# Patient Record
Sex: Female | Born: 1935 | Hispanic: No | Smoking: Former smoker
Health system: Southern US, Community
[De-identification: ages and names within clinical notes are randomized; demographics above are authoritative.]

## PROBLEM LIST (undated history)

## (undated) DIAGNOSIS — G20 Parkinson's disease: Secondary | ICD-10-CM

## (undated) DIAGNOSIS — G20A1 Parkinson's disease without dyskinesia, without mention of fluctuations: Secondary | ICD-10-CM

## (undated) DIAGNOSIS — F0390 Unspecified dementia without behavioral disturbance: Secondary | ICD-10-CM

## (undated) HISTORY — PX: CHOLECYSTECTOMY: SHX55

---

## 1998-08-04 ENCOUNTER — Ambulatory Visit (HOSPITAL_COMMUNITY): Admission: RE | Admit: 1998-08-04 | Discharge: 1998-08-04 | Payer: Self-pay | Admitting: Obstetrics & Gynecology

## 2011-08-09 ENCOUNTER — Ambulatory Visit: Payer: Self-pay | Admitting: Family Medicine

## 2015-10-30 ENCOUNTER — Emergency Department
Admission: EM | Admit: 2015-10-30 | Discharge: 2015-10-30 | Disposition: A | Discharge status: Home / Self Care | Payer: Medicare Other | Attending: Emergency Medicine | Admitting: Emergency Medicine

## 2015-10-30 ENCOUNTER — Encounter: Payer: Self-pay | Admitting: *Deleted

## 2015-10-30 ENCOUNTER — Emergency Department: Payer: Medicare Other

## 2015-10-30 DIAGNOSIS — G20 Parkinson's disease: Secondary | ICD-10-CM | POA: Diagnosis not present

## 2015-10-30 DIAGNOSIS — Z79899 Other long term (current) drug therapy: Secondary | ICD-10-CM | POA: Insufficient documentation

## 2015-10-30 DIAGNOSIS — Z87891 Personal history of nicotine dependence: Secondary | ICD-10-CM | POA: Diagnosis not present

## 2015-10-30 DIAGNOSIS — F0390 Unspecified dementia without behavioral disturbance: Secondary | ICD-10-CM | POA: Diagnosis not present

## 2015-10-30 DIAGNOSIS — M25511 Pain in right shoulder: Secondary | ICD-10-CM | POA: Insufficient documentation

## 2015-10-30 HISTORY — DX: Parkinson's disease without dyskinesia, without mention of fluctuations: G20.A1

## 2015-10-30 HISTORY — DX: Parkinson's disease: G20

## 2015-10-30 HISTORY — DX: Unspecified dementia, unspecified severity, without behavioral disturbance, psychotic disturbance, mood disturbance, and anxiety: F03.90

## 2015-10-30 LAB — TROPONIN I: Troponin I: <0.03 ng/mL (ref <0.03)

## 2015-10-30 LAB — CBC WITH DIFFERENTIAL/PLATELET
Basophils Absolute: 0.1 K/uL (ref 0–0.1)
Basophils Relative: 1 %
Eosinophils Absolute: 0 K/uL (ref 0–0.7)
Eosinophils Relative: 0 %
HCT: 40 % (ref 35.0–47.0)
Hemoglobin: 14.1 g/dL (ref 12.0–16.0)
Lymphocytes Relative: 5 %
Lymphs Abs: 0.5 K/uL — ABNORMAL LOW (ref 1.0–3.6)
MCH: 31.8 pg (ref 26.0–34.0)
MCHC: 35.2 g/dL (ref 32.0–36.0)
MCV: 90.4 fL (ref 80.0–100.0)
Monocytes Absolute: 0.4 K/uL (ref 0.2–0.9)
Monocytes Relative: 4 %
Neutro Abs: 9.6 K/uL — ABNORMAL HIGH (ref 1.4–6.5)
Neutrophils Relative %: 90 %
Platelets: 138 K/uL — ABNORMAL LOW (ref 150–440)
RBC: 4.42 MIL/uL (ref 3.80–5.20)
RDW: 13.4 % (ref 11.5–14.5)
WBC: 10.6 K/uL (ref 3.6–11.0)

## 2015-10-30 LAB — BASIC METABOLIC PANEL WITH GFR
Anion gap: 6 (ref 5–15)
BUN: 19 mg/dL (ref 6–20)
CO2: 28 mmol/L (ref 22–32)
Calcium: 9.1 mg/dL (ref 8.9–10.3)
Chloride: 106 mmol/L (ref 101–111)
Creatinine, Ser: 0.93 mg/dL (ref 0.44–1.00)
GFR calc Af Amer: >60 mL/min
GFR calc non Af Amer: 57 mL/min — ABNORMAL LOW
Glucose, Bld: 107 mg/dL — ABNORMAL HIGH (ref 65–99)
Potassium: 3.6 mmol/L (ref 3.5–5.1)
Sodium: 140 mmol/L (ref 135–145)

## 2015-10-30 NOTE — Discharge Instructions (Signed)
Please seek medical attention for any high fevers, chest pain, shortness of breath, change in behavior, persistent vomiting, bloody stool or any other new or concerning symptoms.  

## 2015-10-30 NOTE — ED Provider Notes (Signed)
[facility] Emergency Department Provider Note   ____________________________________________   I have reviewed the triage vital signs and the nursing notes.   HISTORY  Chief Complaint Arm Pain   History limited by: Not Limited   HPI Allison Pollard is a 82 y.o. female who presents to the emergency room today because of complaints for right shoulder pain. The patient states that the pain started this morning. Did have an unwitnessed fall last night. Family thinks that she might have been walking to the bathroom. They found her on the ground lying on her left side but complaining of right shoulder pain. She was able to get up and walk with her walker. No recent fevers, chest pain or shortness of breath.   Past Medical History:  Diagnosis Date  . Dementia   . Parkinson disease (HCC)     There are no active problems to display for this patient.   Past Surgical History:  Procedure Laterality Date  . CHOLECYSTECTOMY      Prior to Admission medications   Medication Sig Start Date End Date Taking? Authorizing Provider  carbidopa-levodopa (SINEMET IR) 25-100 MG tablet Take 0.5 tablets by mouth 3 (three) times daily. 10/17/15  Yes Historical Provider, MD  Cholecalciferol (VITAMIN D-3) 1000 units CAPS Take 2,000 Units by mouth every morning.   Yes Historical Provider, MD  donepezil (ARICEPT) 10 MG tablet Take 10 mg by mouth every evening.  10/17/15  Yes Historical Provider, MD  fluticasone (FLONASE) 50 MCG/ACT nasal spray Place 2 sprays into both nostrils daily as needed for rhinitis. 09/21/15  Yes Historical Provider, MD  methimazole (TAPAZOLE) 5 MG tablet Take 2.5 mg by mouth every morning. 10/17/15  Yes Historical Provider, MD  mirtazapine (REMERON) 30 MG tablet Take 30 mg by mouth every evening. 10/17/15  Yes Historical Provider, MD  traZODone (DESYREL) 50 MG tablet Take 25 mg by mouth 3 (three) times daily. 09/08/15  Yes Historical Provider, MD     Allergies Buspirone; Citalopram; Clonazepam; Hydroxyzine; Sertraline; Codeine; Hydrocodone; Neomycin; Oxycodone; Penicillins; and Sulfa antibiotics  History reviewed. No pertinent family history.  Social History Social History  Substance Use Topics  . Smoking status: Former Games developermoker  . Smokeless tobacco: Never Used  . Alcohol use Not on file    Review of Systems  Constitutional: Negative for fever. Cardiovascular: Negative for chest pain. Respiratory: Negative for shortness of breath. Gastrointestinal: Negative for abdominal pain, vomiting and diarrhea. Genitourinary: Negative for dysuria. Musculoskeletal: Positive for right shoulder pain. Skin: Negative for rash. Neurological: Negative for headaches, focal weakness or numbness.  10-point ROS otherwise negative.  ____________________________________________   PHYSICAL EXAM:  VITAL SIGNS: ED Triage Vitals  Enc Vitals Group     BP 10/30/15 0932 (!) 168/96     Pulse Rate 10/30/15 0932 89     Resp 10/30/15 0932 18     Temp 10/30/15 0932 98.6 F (37 C)     Temp Source 10/30/15 0932 Oral     SpO2 10/30/15 0932 100 %     Weight 10/30/15 0933 105 lb (47.6 kg)     Height 10/30/15 0933 5\' 2"  (1.575 m)     Head Circumference --      Peak Flow --      Pain Score 10/30/15 0931 3   Constitutional: Alert and oriented.  Eyes: Conjunctivae are normal. Normal extraocular movements. ENT   Head: Normocephalic and atraumatic.   Nose: No congestion/rhinnorhea.   Mouth/Throat: Mucous membranes are moist.   Neck:  No stridor. Hematological/Lymphatic/Immunilogical: No cervical lymphadenopathy. Cardiovascular: Normal rate, regular rhythm.  No murmurs, rubs, or gallops. Respiratory: Normal respiratory effort without tachypnea nor retractions. Breath sounds are clear and equal bilaterally. No wheezes/rales/rhonchi. Gastrointestinal: Soft and nontender. No distention.  Genitourinary: Deferred Musculoskeletal: Very minimal  tenderness to palpation of the patients right shoulder. No obvious deformity. RP 2+ in right upper extremity. Range of motion is decreased in the right shoulder.  Neurologic:  Normal speech and language.  Skin:  Skin is warm, dry and intact. No rash noted. Psychiatric: Mood and affect are normal. Speech and behavior are normal. Patient exhibits appropriate insight and judgment.  ____________________________________________    LABS (pertinent positives/negatives)  Labs Reviewed  CBC WITH DIFFERENTIAL/PLATELET - Abnormal; Notable for the following:       Result Value   Platelets 138 (*)    Neutro Abs 9.6 (*)    Lymphs Abs 0.5 (*)    All other components within normal limits  BASIC METABOLIC PANEL - Abnormal; Notable for the following:    Glucose, Bld 107 (*)    GFR calc non Af Amer 57 (*)    All other components within normal limits  TROPONIN I     ____________________________________________   EKG  None  ____________________________________________    RADIOLOGY  Right shoulder IMPRESSION:  Moderate degenerative joint disease of right acromioclavicular  joint. No acute abnormality seen in right shoulder.     CT head   IMPRESSION:  No acute intracranial abnormality. Mild cerebral atrophy.  Periventricular and patchy subcortical white matter decreased  attenuation probable due to chronic small vessel ischemic changes.  No acute cortical infarction.     ___________________________________________   PROCEDURES  Procedures  ____________________________________________   INITIAL IMPRESSION / ASSESSMENT AND PLAN / ED COURSE  Pertinent labs & imaging results that were available during my care of the patient were reviewed by me and considered in my medical decision making (see chart for details).  Patient presented to the emergency department today after an unwitnessed fall complaining of right shoulder pain. On exam patient was minimally tender in the right  shoulder. She did have decreased range of motion of that right shoulder. It did not appear to be painful as active moving her shoulder. Shoulder x-ray was negative. Had x-ray negative. On reexam patient was able to move her right arm better. Family states that she will have intermittent episodes where she is not able to move her arms or walk is easily and then be able to do it easily. This point I discussed I cannot fully rule out a stroke of her family is comfortable taking patient home. Think this is reasonable. ____________________________________________   FINAL CLINICAL IMPRESSION(S) / ED DIAGNOSES  Final diagnoses:  Right shoulder pain     Note: This dictation was prepared with Dragon dictation. Any transcriptional errors that result from this process are unintentional    Phineas Semen, MD 10/30/15 1337

## 2015-10-30 NOTE — ED Triage Notes (Signed)
Pt arrives via EMS from home after she rolled out of bed last night, states right arm pain upon arrival, EDP at bedside

## 2015-12-21 ENCOUNTER — Emergency Department: Payer: Medicare Other

## 2015-12-21 ENCOUNTER — Inpatient Hospital Stay
Admission: EM | Admit: 2015-12-21 | Discharge: 2015-12-24 | DRG: 469 | Disposition: A | Discharge status: Skilled Nursing Facility | Payer: Medicare Other | Attending: Internal Medicine | Admitting: Internal Medicine

## 2015-12-21 DIAGNOSIS — Z880 Allergy status to penicillin: Secondary | ICD-10-CM

## 2015-12-21 DIAGNOSIS — N390 Urinary tract infection, site not specified: Secondary | ICD-10-CM | POA: Diagnosis present

## 2015-12-21 DIAGNOSIS — Z833 Family history of diabetes mellitus: Secondary | ICD-10-CM

## 2015-12-21 DIAGNOSIS — E43 Unspecified severe protein-calorie malnutrition: Secondary | ICD-10-CM | POA: Diagnosis present

## 2015-12-21 DIAGNOSIS — Z87891 Personal history of nicotine dependence: Secondary | ICD-10-CM | POA: Diagnosis not present

## 2015-12-21 DIAGNOSIS — S72012A Unspecified intracapsular fracture of left femur, initial encounter for closed fracture: Secondary | ICD-10-CM | POA: Diagnosis present

## 2015-12-21 DIAGNOSIS — G20 Parkinson's disease: Secondary | ICD-10-CM | POA: Diagnosis present

## 2015-12-21 DIAGNOSIS — G8918 Other acute postprocedural pain: Secondary | ICD-10-CM

## 2015-12-21 DIAGNOSIS — I700 Atherosclerosis of aorta: Secondary | ICD-10-CM | POA: Diagnosis present

## 2015-12-21 DIAGNOSIS — Z681 Body mass index (BMI) 19 or less, adult: Secondary | ICD-10-CM | POA: Diagnosis not present

## 2015-12-21 DIAGNOSIS — S72002A Fracture of unspecified part of neck of left femur, initial encounter for closed fracture: Secondary | ICD-10-CM | POA: Diagnosis present

## 2015-12-21 DIAGNOSIS — Z820 Family history of epilepsy and other diseases of the nervous system: Secondary | ICD-10-CM | POA: Diagnosis not present

## 2015-12-21 DIAGNOSIS — Z888 Allergy status to other drugs, medicaments and biological substances status: Secondary | ICD-10-CM

## 2015-12-21 DIAGNOSIS — W1830XA Fall on same level, unspecified, initial encounter: Secondary | ICD-10-CM | POA: Diagnosis present

## 2015-12-21 DIAGNOSIS — F0390 Unspecified dementia without behavioral disturbance: Secondary | ICD-10-CM | POA: Diagnosis present

## 2015-12-21 DIAGNOSIS — Z79899 Other long term (current) drug therapy: Secondary | ICD-10-CM | POA: Diagnosis not present

## 2015-12-21 DIAGNOSIS — Z882 Allergy status to sulfonamides status: Secondary | ICD-10-CM

## 2015-12-21 DIAGNOSIS — Z419 Encounter for procedure for purposes other than remedying health state, unspecified: Secondary | ICD-10-CM

## 2015-12-21 DIAGNOSIS — F329 Major depressive disorder, single episode, unspecified: Secondary | ICD-10-CM | POA: Diagnosis present

## 2015-12-21 DIAGNOSIS — Z7951 Long term (current) use of inhaled steroids: Secondary | ICD-10-CM

## 2015-12-21 DIAGNOSIS — M6281 Muscle weakness (generalized): Secondary | ICD-10-CM

## 2015-12-21 DIAGNOSIS — R262 Difficulty in walking, not elsewhere classified: Secondary | ICD-10-CM

## 2015-12-21 DIAGNOSIS — M25552 Pain in left hip: Secondary | ICD-10-CM | POA: Diagnosis present

## 2015-12-21 LAB — BASIC METABOLIC PANEL
ANION GAP: 7 (ref 5–15)
BUN: 15 mg/dL (ref 6–20)
CHLORIDE: 101 mmol/L (ref 101–111)
CO2: 30 mmol/L (ref 22–32)
Calcium: 9.1 mg/dL (ref 8.9–10.3)
Creatinine, Ser: 0.92 mg/dL (ref 0.44–1.00)
GFR calc Af Amer: >60 mL/min (ref >60)
GFR calc non Af Amer: 57 mL/min — ABNORMAL LOW (ref >60)
GLUCOSE: 142 mg/dL — AB (ref 65–99)
POTASSIUM: 3.5 mmol/L (ref 3.5–5.1)
Sodium: 138 mmol/L (ref 135–145)

## 2015-12-21 LAB — URINALYSIS COMPLETE WITH MICROSCOPIC (ARMC ONLY)
BILIRUBIN URINE: NEGATIVE
GLUCOSE, UA: NEGATIVE mg/dL
NITRITE: NEGATIVE
Protein, ur: 100 mg/dL — AB
SPECIFIC GRAVITY, URINE: 1.013 (ref 1.005–1.030)
Squamous Epithelial / LPF: NONE SEEN
pH: 7 (ref 5.0–8.0)

## 2015-12-21 LAB — CBC
HEMATOCRIT: 32.3 % — AB (ref 35.0–47.0)
HEMOGLOBIN: 11.2 g/dL — AB (ref 12.0–16.0)
MCH: 31.1 pg (ref 26.0–34.0)
MCHC: 34.6 g/dL (ref 32.0–36.0)
MCV: 89.9 fL (ref 80.0–100.0)
Platelets: 115 10*3/uL — ABNORMAL LOW (ref 150–440)
RBC: 3.59 MIL/uL — AB (ref 3.80–5.20)
RDW: 13.8 % (ref 11.5–14.5)
WBC: 7.9 10*3/uL (ref 3.6–11.0)

## 2015-12-21 LAB — APTT: aPTT: 30 seconds (ref 24–36)

## 2015-12-21 LAB — SURGICAL PCR SCREEN
MRSA, PCR: NEGATIVE
STAPHYLOCOCCUS AUREUS: NEGATIVE

## 2015-12-21 LAB — TYPE AND SCREEN
ABO/RH(D): A POS
Antibody Screen: NEGATIVE

## 2015-12-21 LAB — PROTIME-INR
INR: 1.17
Prothrombin Time: 15 seconds (ref 11.4–15.2)

## 2015-12-21 MED ORDER — CLINDAMYCIN PHOSPHATE 600 MG/50ML IV SOLN
600.0000 mg | Freq: Once | INTRAVENOUS | Status: AC
  Administered 2015-12-22: 600 mg via INTRAVENOUS
  Filled 2015-12-21: qty 50

## 2015-12-21 MED ORDER — METHIMAZOLE 5 MG PO TABS
2.5000 mg | ORAL_TABLET | ORAL | Status: DC
  Administered 2015-12-23 – 2015-12-24 (×2): 2.5 mg via ORAL
  Filled 2015-12-21 (×2): qty 1

## 2015-12-21 MED ORDER — VITAMIN D 1000 UNITS PO TABS
2000.0000 [IU] | ORAL_TABLET | ORAL | Status: DC
  Administered 2015-12-23: 2000 [IU] via ORAL
  Filled 2015-12-21: qty 2

## 2015-12-21 MED ORDER — SODIUM CHLORIDE 0.9 % IV SOLN
INTRAVENOUS | Status: DC
  Administered 2015-12-22: 50 mL/h via INTRAVENOUS

## 2015-12-21 MED ORDER — MORPHINE SULFATE (PF) 2 MG/ML IV SOLN: 2.0000 mg | INTRAVENOUS | Status: DC | PRN

## 2015-12-21 MED ORDER — FLUTICASONE PROPIONATE 50 MCG/ACT NA SUSP
2.0000 | Freq: Every day | NASAL | Status: DC | PRN
  Filled 2015-12-21: qty 16

## 2015-12-21 MED ORDER — MORPHINE SULFATE (PF) 2 MG/ML IV SOLN
1.0000 mg | INTRAVENOUS | Status: DC | PRN
  Filled 2015-12-21: qty 1

## 2015-12-21 MED ORDER — QUETIAPINE FUMARATE 25 MG PO TABS
25.0000 mg | ORAL_TABLET | Freq: Two times a day (BID) | ORAL | Status: DC
  Administered 2015-12-21 – 2015-12-24 (×6): 25 mg via ORAL
  Filled 2015-12-21 (×6): qty 1

## 2015-12-21 MED ORDER — FENTANYL CITRATE (PF) 100 MCG/2ML IJ SOLN
25.0000 ug | Freq: Once | INTRAMUSCULAR | Status: AC
  Administered 2015-12-21: 25 ug via INTRAVENOUS
  Filled 2015-12-21: qty 2

## 2015-12-21 MED ORDER — TRAZODONE HCL 50 MG PO TABS
25.0000 mg | ORAL_TABLET | Freq: Three times a day (TID) | ORAL | Status: DC
  Administered 2015-12-21 – 2015-12-24 (×9): 25 mg via ORAL
  Filled 2015-12-21 (×9): qty 1

## 2015-12-21 MED ORDER — CARBIDOPA-LEVODOPA 25-100 MG PO TABS
0.5000 | ORAL_TABLET | Freq: Three times a day (TID) | ORAL | Status: DC
  Administered 2015-12-21 – 2015-12-24 (×8): 0.5 via ORAL
  Filled 2015-12-21: qty 1
  Filled 2015-12-21: qty 0.5
  Filled 2015-12-21 (×4): qty 1
  Filled 2015-12-21: qty 0.5
  Filled 2015-12-21 (×2): qty 1

## 2015-12-21 MED ORDER — ACETAMINOPHEN 650 MG RE SUPP: 650.0000 mg | Freq: Four times a day (QID) | RECTAL | Status: DC | PRN

## 2015-12-21 MED ORDER — SODIUM CHLORIDE 0.9 % IV SOLN: Freq: Once | INTRAVENOUS | Status: DC

## 2015-12-21 MED ORDER — MIRTAZAPINE 15 MG PO TABS
30.0000 mg | ORAL_TABLET | Freq: Every evening | ORAL | Status: DC
  Administered 2015-12-21 – 2015-12-24 (×4): 30 mg via ORAL
  Filled 2015-12-21 (×4): qty 2

## 2015-12-21 MED ORDER — ONDANSETRON HCL 4 MG/2ML IJ SOLN: 4.0000 mg | Freq: Four times a day (QID) | INTRAMUSCULAR | Status: DC | PRN

## 2015-12-21 MED ORDER — DONEPEZIL HCL 5 MG PO TABS
10.0000 mg | ORAL_TABLET | Freq: Every evening | ORAL | Status: DC
  Administered 2015-12-21 – 2015-12-24 (×4): 10 mg via ORAL
  Filled 2015-12-21 (×4): qty 2

## 2015-12-21 MED ORDER — ONDANSETRON HCL 4 MG PO TABS: 4.0000 mg | ORAL_TABLET | Freq: Four times a day (QID) | ORAL | Status: DC | PRN

## 2015-12-21 MED ORDER — ACETAMINOPHEN 325 MG PO TABS
650.0000 mg | ORAL_TABLET | Freq: Four times a day (QID) | ORAL | Status: DC | PRN
  Administered 2015-12-21 – 2015-12-23 (×2): 650 mg via ORAL
  Filled 2015-12-21 (×2): qty 2

## 2015-12-21 MED ORDER — SODIUM CHLORIDE 0.9 % IV BOLUS (SEPSIS)
500.0000 mL | Freq: Once | INTRAVENOUS | Status: AC
  Administered 2015-12-21: 500 mL via INTRAVENOUS

## 2015-12-21 MED ORDER — PANTOPRAZOLE SODIUM 40 MG PO TBEC
40.0000 mg | DELAYED_RELEASE_TABLET | Freq: Every day | ORAL | Status: DC
  Administered 2015-12-23: 40 mg via ORAL
  Filled 2015-12-21 (×2): qty 1

## 2015-12-21 NOTE — Consult Note (Signed)
82-year-old white female seen for evaluation of left femoral neck fracture. She had a fall 3 days ago 4 days ago and had some difficulty walking but she always does. She normally uses a walker and is essentially just a household ambulator. She suffers from dementia as well as parkinsonism and has been declining over the past year. She has also had multiple falls. She felt a pop and went to the ground brought to the emergency room where she is found to have a displaced femoral neck fracture.  On exam her leg is shortened and actually rotated on the left. She has a trace to says pedis pulse, able to flex extend the toes, has intact sensation to the plantar and dorsal aspect of the foot. Skin around the hip is normal.  Radiographic exam: X-rays show a completely displaced femoral neck fracture subcapital. Minimal degenerative arthritis to the hip.  Plan: Recommendation is for left hip hemiarthroplasty with a direct anterior approach. This should minimize risk of dislocation with her parkinsonism and allow for early mobilization. Discussed risks of surgery in particular blood clot, infection and fracture.

## 2015-12-21 NOTE — Progress Notes (Signed)
PASRR number screen complete. Pt's PASRR # is [name], MSW, Theresia MajorsLCSWA [phone]

## 2015-12-21 NOTE — ED Notes (Signed)
Pt in via triage, coming directly from xray, pt positive for left hip fx.  Pt family reports fall on Saturday, up and ambulatory since then but complaining of increasing pain over the last few days.  Pt A/Ox4, vitals WDL, no immediate distress at this time.  MD at bedside.

## 2015-12-21 NOTE — ED Triage Notes (Signed)
Per pt daughter pt fell Saturday and has been up walking with walker as normal, today c/o severe pain in the left hip and is unable to bare wt..Marland Kitchen

## 2015-12-21 NOTE — ED Notes (Signed)
Offered Morphine to patient; pt denies any pain at this time and refuses pain medication.  Explained to pt that we would soon be moving her to a room and would have to transfer from one bed to another.  Pt still refuses pain medication.

## 2015-12-21 NOTE — Clinical Social Work Note (Signed)
Clinical Social Work Assessment  Patient Details  Name: Allison RileBarbara H Gelb MRN: [id number] Date of Birth: Mar 07, 1933  Date of referral:  12/21/15               Reason for consult:  Facility Placement                Permission sought to share information with:  Family Supports, Oceanographeracility Contact Representative Permission granted to share information::  Yes, Verbal Permission Granted  Name::        Agency::     Relationship::     Contact Information:     Housing/Transportation Living arrangements for the past 2 months:  Single Family Home Source of Information:  Adult Children Patient Interpreter Needed:  None Criminal Activity/Legal Involvement Pertinent to Current Situation/Hospitalization:  No - Comment as needed Significant Relationships:  Adult Children, Other Family Members Lives with:  Self Do you feel safe going back to the place where you live?  No Need for family participation in patient care:  Yes (Comment)  Care giving concerns: Pt had a fall at home and suffered a hip fracture.   Social Worker assessment / plan:  CSW received consult for possible SNF placement. CSW engaged with pt and pt's daughter at pt's bedside. CSW introduced herself and her role as a Child psychotherapistsocial worker. Pt currently lives alone in her home but has a son that lives next door. Pt's son comes over daily to have meals with the pt and checks on her frequently. Pt also receives home health services. Per pt's daughter pt had a fall at home and has been experiencing hip pain ever since. Pt will be admitted to the hospital and will have surgery. Upon d/c from the hospital pt and pt's daughter are agreeable to pt going to SNF for rehab. Pt's daughter states that she would like for pt to return home after rehab is complete. During the time of the assessment pt was in and out of sleep, but pleasant. Pt's daughter explained that pt has fluctuating levels of orientation and often "travels back in time" due to her dementia  diagnosis.   CSW explained the SNF referral process to pt's daughter and answered questions that she had. Pt's daughter did not identify a facility preference, however, she did mention that she would like for pt to stay local to [facility]. CSW completed FL2, submitted PASRR number, and faxed out referral to local SNFs. CSW is still awaiting PASRR number. CSW also provided pt's daughter with a list of facilities in the area. Ortho CSW is aware and will continue to follow pt once she is admitted.  Employment status:  Retired Database administratornsurance information:  Managed Medicare PT Recommendations:  Not assessed at this time Information / Referral to community resources:  Skilled Nursing Facility  Patient/Family's Response to care: Pt will be admitted to the hospital and will transfer to SNF upon d/c.  Patient/Family's Understanding of and Emotional Response to Diagnosis, Current Treatment, and Prognosis: Pt and pt's family are appreciative of the care provided by CSW at this time.  Emotional Assessment Appearance:  Appears stated age Attitude/Demeanor/Rapport:  Lethargic Affect (typically observed):  Calm Orientation:  Oriented to Self, Fluctuating Orientation (Suspected and/or reported Sundowners) Alcohol / Substance use:  Not Applicable Psych involvement (Current and /or in the community):  No (Comment)  Discharge Needs  Concerns to be addressed:  Care Coordination Readmission within the last 30 days:  No Current discharge risk:  None Barriers to Discharge:  Continued  Medical Work up   KeyCorpLynn B Juleah Paradise, LCSWA 12/21/2015, 1:45 PM

## 2015-12-21 NOTE — H&P (Signed)
[facility] [place] [facility]   PATIENT NAME: Allison Pollard    MR#:  [id number]  DATE OF BIRTH:  Jan 19, 1934  DATE OF ADMISSION:  12/21/2015  PRIMARY CARE PHYSICIAN: HART, Charlton Amor, MD   REQUESTING/REFERRING PHYSICIAN: Dr. Nita Sickle  CHIEF COMPLAINT:   Chief Complaint  Patient presents with  . Hip Pain    HISTORY OF PRESENT ILLNESS:  Allison Pollard  is a 82 y.o. female with a known history of Parkinson's disease, dementia who presented to the hospital due to left hip pain and difficulty walking. Patient had a fall at home 4 days ago but was able to get herself up and ambulate. Today when she got up from the toilet she heard a pop on her left side of her hip and went to the ground. She was not able to ambulate or bear any weight on the left side and therefore the daughter brought her to the ER for further evaluation. Patient's x-rays were consistent with a left hip fracture and therefore hospitalist services were contacted further treatment and evaluation. Patient had no head trauma related to her previous fall. She denies any prodromal symptoms prior to her fall.  PAST MEDICAL HISTORY:   Past Medical History:  Diagnosis Date  . Dementia   . Parkinson disease (HCC)     PAST SURGICAL HISTORY:   Past Surgical History:  Procedure Laterality Date  . CHOLECYSTECTOMY      SOCIAL HISTORY:   Social History  Substance Use Topics  . Smoking status: Former Games developer  . Smokeless tobacco: Never Used  . Alcohol use No    FAMILY HISTORY:   Family History  Problem Relation Age of Onset  . Diabetes Mother   . Alzheimer's disease Sister   . Alzheimer's disease Brother     DRUG ALLERGIES:   Allergies  Allergen Reactions  . Buspirone Other (See Comments)    UTI and Shakiness  . Citalopram Other (See Comments)    Severe Confusion  . Clonazepam Other (See Comments)    Panic attacks and paranoid  . Hydroxyzine Hives  . Sertraline Other (See  Comments)    parkinson's type shaking  . Codeine Rash  . Hydrocodone Rash  . Neomycin Rash  . Oxycodone Rash  . Penicillins Rash    Has patient had a PCN reaction causing immediate rash, facial/tongue/throat swelling, SOB or lightheadedness with hypotension: Yes Has patient had a PCN reaction causing severe rash involving mucus membranes or skin necrosis: No Has patient had a PCN reaction that required hospitalization No Has patient had a PCN reaction occurring within the last 10 years: No If all of the above answers are "NO", then may proceed with Cephalosporin use.  . Sulfa Antibiotics Rash    REVIEW OF SYSTEMS:   Review of Systems  Constitutional: Negative for fever and weight loss.  HENT: Negative for congestion, nosebleeds and tinnitus.   Eyes: Negative for blurred vision, double vision and redness.  Respiratory: Negative for cough, hemoptysis and shortness of breath.   Cardiovascular: Negative for chest pain, orthopnea, leg swelling and PND.  Gastrointestinal: Negative for abdominal pain, diarrhea, melena, nausea and vomiting.  Genitourinary: Negative for dysuria, hematuria and urgency.  Musculoskeletal: Positive for falls and joint pain (left Hip).  Neurological: Negative for dizziness, tingling, sensory change, focal weakness, seizures, weakness and headaches.  Endo/Heme/Allergies: Negative for polydipsia. Does not bruise/bleed easily.  Psychiatric/Behavioral: Negative for depression and memory loss. The patient is not nervous/anxious.  MEDICATIONS AT HOME:   Prior to Admission medications   Medication Sig Start Date End Date Taking? Authorizing Provider  carbidopa-levodopa (SINEMET IR) 25-100 MG tablet Take 0.5 tablets by mouth 3 (three) times daily. 10/17/15  Yes Historical Provider, MD  Cholecalciferol (VITAMIN D-3) 1000 units CAPS Take 2,000 Units by mouth every morning.   Yes Historical Provider, MD  donepezil (ARICEPT) 10 MG tablet Take 10 mg by mouth every  evening.  10/17/15  Yes Historical Provider, MD  fluticasone (FLONASE) 50 MCG/ACT nasal spray Place 2 sprays into both nostrils daily as needed for rhinitis. 09/21/15  Yes Historical Provider, MD  methimazole (TAPAZOLE) 5 MG tablet Take 2.5 mg by mouth every morning. 10/17/15  Yes Historical Provider, MD  mirtazapine (REMERON) 30 MG tablet Take 30 mg by mouth every evening. 10/17/15  Yes Historical Provider, MD  mupirocin ointment (BACTROBAN) 2 % Apply 1 application topically 3 (three) times daily. 12/14/15 12/21/15 Yes Historical Provider, MD  omeprazole (PRILOSEC) 20 MG capsule Take 1 capsule by mouth daily. 11/24/15  Yes Historical Provider, MD  QUEtiapine (SEROQUEL) 25 MG tablet Take 25 mg by mouth 2 (two) times daily.  12/08/15  Yes Historical Provider, MD  traZODone (DESYREL) 50 MG tablet Take 25 mg by mouth 3 (three) times daily. 09/08/15  Yes Historical Provider, MD      VITAL SIGNS:  Blood pressure 97/72, pulse 100, temperature 97.4 F (36.3 C), temperature source Oral, resp. rate 16, height 5\' 3"  (1.6 m), weight 44.5 kg (98 lb), SpO2 100 %.  PHYSICAL EXAMINATION:  Physical Exam  GENERAL:  82 y.o.-year-old patient lying in the bed in no acute distress.  EYES: Pupils equal, round, reactive to light and accommodation. No scleral icterus. Extraocular muscles intact.  HEENT: Head atraumatic, normocephalic. Oropharynx and nasopharynx clear. No oropharyngeal erythema, dry oral mucosa  NECK:  Supple, no jugular venous distention. No thyroid enlargement, no tenderness.  LUNGS: Normal breath sounds bilaterally, no wheezing, rales, rhonchi. No use of accessory muscles of respiration.  CARDIOVASCULAR: S1, S2 RRR. No murmurs, rubs, gallops, clicks.  ABDOMEN: Soft, nontender, nondistended. Bowel sounds present. No organomegaly or mass.  EXTREMITIES: No pedal edema, cyanosis, or clubbing. + 2 pedal & radial pulses b/l.  Left lower ext. Ext. Rotated and shortened due to left hip fracture.  NEUROLOGIC:  Cranial nerves II through XII are intact. No focal Motor or sensory deficits appreciated b/l. Globally weak.  PSYCHIATRIC: The patient is alert and oriented x 1. SKIN: No obvious rash, lesion, or ulcer.   LABORATORY PANEL:   CBC  Recent Labs Lab 12/21/15 1215  WBC 7.9  HGB 11.2*  HCT 32.3*  PLT 115*   ------------------------------------------------------------------------------------------------------------------  Chemistries   Recent Labs Lab 12/21/15 1215  NA 138  K 3.5  CL 101  CO2 30  GLUCOSE 142*  BUN 15  CREATININE 0.92  CALCIUM 9.1   ------------------------------------------------------------------------------------------------------------------  Cardiac Enzymes No results for input(s): TROPONINI in the last 168 hours. ------------------------------------------------------------------------------------------------------------------  RADIOLOGY:  Dg Chest 1 View  Result Date: 12/21/2015 CLINICAL DATA:  Pain following fall EXAM: CHEST 1 VIEW COMPARISON:  August 09, 2011 FINDINGS: There is no edema or consolidation. The heart size and pulmonary vascularity are normal. No adenopathy. There is atherosclerotic calcification in the aorta. There is thoracolumbar levoscoliosis. IMPRESSION: No edema or consolidation.  There is aortic atherosclerosis. Electronically Signed   By: Bretta BangWilliam  Woodruff III M.D.   On: 12/21/2015 11:52   Dg Hip Unilat With Pelvis 2-3 Views Left  Result  Date: 12/21/2015 CLINICAL DATA:  Pain following fall EXAM: DG HIP (WITH OR WITHOUT PELVIS) 2-3V LEFT COMPARISON:  None. FINDINGS: Frontal pelvis as well as frontal and lateral left hip images were obtained it. There is a fracture of the subcapital left femoral neck. There is superior displacement of the femoral shaft with respect to the femoral head. No other fracture evident. No dislocation. Bones are osteoporotic. There is mild symmetric narrowing of both hip joints. IMPRESSION: Subcapital femoral  neck fracture on the left with mild fracture fragment displacement. Bones osteoporotic. Mild symmetric narrowing of both hip joints. No dislocation evident. Electronically Signed   By: Bretta BangWilliam  Woodruff III M.D.   On: 12/21/2015 11:53     IMPRESSION AND PLAN:   82 year old female with past medical history of Parkinson's disease, dementia who presents to the hospital after a mechanical fall and noted to have a left hip fracture.   1. Preoperative cardiovascular examination-patient is a low risk for noncardiac surgery. -No absolute contraindications surgery at this time. EKG has been reviewed shows no acute ST or T-wave changes.  2. Left hip fracture-secondary to a mechanical fall. -Continue pain control and care as per orthopedics who have been consulted.  3. History of Parkinson's disease-continue Sinemet.  4. History of hyperthyroidism-continue Tapazole.  5. Dementnia-continue Aricept.  6. Depression - cont. Seroquel, Trazodone.   All the records are reviewed and case discussed with ED provider. Management plans discussed with the patient, family and they are in agreement.  CODE STATUS: Full code  TOTAL TIME TAKING CARE OF THIS PATIENT: 45 minutes.    Houston SirenSAINANI,Brylea Pita J M.D on 12/21/2015 at 1:17 PM  Between 7am to 6pm - Pager - [phone]  After 6pm go to [email or web address] - password [email or web address] [facility]  [facility]  Office  [phone]  CC: Primary care physician; HART, Charlton AmorLAURA CHRISTINE, MD

## 2015-12-21 NOTE — [place] FL2 (Signed)
[place] MEDICAID FL2 LEVEL OF CARE SCREENING TOOL     IDENTIFICATION  Patient Name: Allison Pollard Birthdate: [birth year] Sex: female Admission Date (Current Location): 12/21/2015  [facility] [place]   Facility and Address:  [facility], [address], [place], [place] [address]      Provider Number: [id number]  Attending Physician Name and Address:  Houston SirenVivek J Sainani, MD  Relative Name and Phone Number:       Current Level of Care: Hospital Recommended Level of Care: Skilled Nursing Facility Prior Approval Number:    Date Approved/Denied:   PASRR Number:    Discharge Plan: SNF    Current Diagnoses: Patient Active Problem List   Diagnosis Date Noted  . Closed left hip fracture (HCC) 12/21/2015    Orientation RESPIRATION BLADDER Height & Weight     Self, Place  Normal Incontinent Weight: 98 lb (44.5 kg) Height:  5\' 3"  (160 cm)  BEHAVIORAL SYMPTOMS/MOOD NEUROLOGICAL BOWEL NUTRITION STATUS      Continent Diet (Heart healthy)  AMBULATORY STATUS COMMUNICATION OF NEEDS Skin   Extensive Assist Verbally Normal                       Personal Care Assistance Level of Assistance  Bathing, Feeding, Dressing Bathing Assistance: Limited assistance Feeding assistance: Independent Dressing Assistance: Limited assistance     Functional Limitations Info  Sight, Hearing, Speech Sight Info: Adequate Hearing Info: Adequate Speech Info: Adequate    SPECIAL CARE FACTORS FREQUENCY  PT (By licensed PT), OT (By licensed OT)     PT Frequency: 5x OT Frequency: 5x            Contractures Contractures Info: Not present    Additional Factors Info  Code Status, Allergies Code Status Info: Full Code  Allergies Info: Buspirone, Citalopram, Clonazepam, Hydroxyzine, Sertraline, Codeine, Hydrocodone, Neomycin, Oxycodone, Penicillins, Sulfa Antibiotics           Current Medications (12/21/2015):  This is the current  hospital active medication list Current Facility-Administered Medications  Medication Dose Route Frequency Provider Last Rate Last Dose  . 0.9 %  sodium chloride infusion   Intravenous Once [place] Veronese, MD      . Melene Muller[START ON 12/22/2015] clindamycin (CLEOCIN) IVPB 600 mg  600 mg Intravenous Once Kennedy BuckerMichael Menz, MD      . morphine 2 MG/ML injection 1 mg  1 mg Intravenous Q1H PRN Kennedy BuckerMichael Menz, MD      . sodium chloride 0.9 % bolus 500 mL  500 mL Intravenous Once Nita Sicklearolina Veronese, MD       Current Outpatient Prescriptions  Medication Sig Dispense Refill  . carbidopa-levodopa (SINEMET IR) 25-100 MG tablet Take 0.5 tablets by mouth 3 (three) times daily.    . Cholecalciferol (VITAMIN D-3) 1000 units CAPS Take 2,000 Units by mouth every morning.    . donepezil (ARICEPT) 10 MG tablet Take 10 mg by mouth every evening.     . fluticasone (FLONASE) 50 MCG/ACT nasal spray Place 2 sprays into both nostrils daily as needed for rhinitis.    . methimazole (TAPAZOLE) 5 MG tablet Take 2.5 mg by mouth every morning.    . mirtazapine (REMERON) 30 MG tablet Take 30 mg by mouth every evening.    . mupirocin ointment (BACTROBAN) 2 % Apply 1 application topically 3 (three) times daily.    Marland Kitchen. omeprazole (PRILOSEC) 20 MG capsule Take 1 capsule by mouth daily.    . QUEtiapine (SEROQUEL)  25 MG tablet Take 25 mg by mouth 2 (two) times daily.     . traZODone (DESYREL) 50 MG tablet Take 25 mg by mouth 3 (three) times daily.       Discharge Medications: Please see discharge summary for a list of discharge medications.  Relevant Imaging Results:  Relevant Lab Results:   Additional Information SSN: [id number]  Jonathon JordanLynn B Eriel Dunckel, LCSWA

## 2015-12-21 NOTE — ED Provider Notes (Signed)
[facility] Emergency Department Provider Note  ____________________________________________  Time seen: Approximately 11:49 AM  I have reviewed the triage vital signs and the nursing notes.   HISTORY  Chief Complaint Hip Pain   HPI Allison Pollard is a 82 y.o. female with history of Parkinson's disease and dementia who presents for evaluation of left hip pain. According to the daughters and the patient she fell Saturday. She was trying to grab something on the floor when she rolled on to your left side hitting her left hip and left shoulder on the floor. She immediately got up by herself and has been ambulating on the leg with mild pain. She does have a walker at home. She lives alone but has home health care. Yesterday evening she was getting up from the toilet and she reports that she felt a pop on her left hip and since then the pain has intensified. This morning she has been unable to ambulate due to the severity of the pain. Patient's fall was witnessed by family members who report patient did not hit her head on the floor. She has no headache or neck pain. She did not pass out. She did not have dizziness, palpitations, chest pain, headache, shortness of breath, back pain, or abdominal pain preceding her mechanical fall. Last meal at 9:30AM.   Past Medical History:  Diagnosis Date  . Dementia   . Parkinson disease (HCC)     There are no active problems to display for this patient.   Past Surgical History:  Procedure Laterality Date  . CHOLECYSTECTOMY      Prior to Admission medications   Medication Sig Start Date End Date Taking? Authorizing Provider  carbidopa-levodopa (SINEMET IR) 25-100 MG tablet Take 0.5 tablets by mouth 3 (three) times daily. 10/17/15  Yes Historical Provider, MD  Cholecalciferol (VITAMIN D-3) 1000 units CAPS Take 2,000 Units by mouth every morning.   Yes Historical Provider, MD  donepezil (ARICEPT) 10 MG tablet Take 10 mg by  mouth every evening.  10/17/15  Yes Historical Provider, MD  fluticasone (FLONASE) 50 MCG/ACT nasal spray Place 2 sprays into both nostrils daily as needed for rhinitis. 09/21/15  Yes Historical Provider, MD  methimazole (TAPAZOLE) 5 MG tablet Take 2.5 mg by mouth every morning. 10/17/15  Yes Historical Provider, MD  mirtazapine (REMERON) 30 MG tablet Take 30 mg by mouth every evening. 10/17/15  Yes Historical Provider, MD  mupirocin ointment (BACTROBAN) 2 % Apply 1 application topically 3 (three) times daily. 12/14/15 12/21/15 Yes Historical Provider, MD  omeprazole (PRILOSEC) 20 MG capsule Take 1 capsule by mouth daily. 11/24/15  Yes Historical Provider, MD  QUEtiapine (SEROQUEL) 25 MG tablet Take 25 mg by mouth 2 (two) times daily.  12/08/15  Yes Historical Provider, MD  traZODone (DESYREL) 50 MG tablet Take 25 mg by mouth 3 (three) times daily. 09/08/15  Yes Historical Provider, MD    Allergies Buspirone; Citalopram; Clonazepam; Hydroxyzine; Sertraline; Codeine; Hydrocodone; Neomycin; Oxycodone; Penicillins; and Sulfa antibiotics  No family history on file.  Social History Social History  Substance Use Topics  . Smoking status: Former Games developer  . Smokeless tobacco: Never Used  . Alcohol use No    Review of Systems  Constitutional: Negative for fever. Eyes: Negative for visual changes. ENT: Negative for sore throat. Cardiovascular: Negative for chest pain. Respiratory: Negative for shortness of breath. Gastrointestinal: Negative for abdominal pain, vomiting or diarrhea. Genitourinary: Negative for dysuria. Musculoskeletal: Negative for back pain. + left hip pain  Skin: Negative for rash. Neurological: Negative for headaches, weakness or numbness.  ____________________________________________   PHYSICAL EXAM:  VITAL SIGNS: ED Triage Vitals  Enc Vitals Group     BP 12/21/15 1106 97/72     Pulse Rate 12/21/15 1106 100     Resp 12/21/15 1106 16     Temp 12/21/15 1106 97.4 F (36.3  C)     Temp Source 12/21/15 1106 Oral     SpO2 12/21/15 1106 100 %     Weight 12/21/15 1106 98 lb (44.5 kg)     Height 12/21/15 1106 5\' 3"  (1.6 m)     Head Circumference --      Peak Flow --      Pain Score 12/21/15 1108 10     Pain Loc --      Pain Edu? --      Excl. in GC? --     Constitutional: Alert and oriented. Well appearing and in no apparent distress. HEENT:      Head: Normocephalic and atraumatic.         Eyes: Conjunctivae are normal. Sclera is non-icteric. EOMI. PERRL      Mouth/Throat: Mucous membranes are moist.       Neck: Supple with no signs of meningismus.No C-spine tenderness Cardiovascular: Regular rate and rhythm. No murmurs, gallops, or rubs. 2+ symmetrical distal pulses are present in all extremities. No JVD. Respiratory: Normal respiratory effort. Lungs are clear to auscultation bilaterally. No wheezes, crackles, or rhonchi.  Gastrointestinal: Soft, non tender, and non distended with positive bowel sounds. No rebound or guarding. Musculoskeletal: ttp over the left hip joint, severe pain with minimal movement of the joint. No t and l spine ttp. Neurologic: Normal speech and language. Face is symmetric. Moving all extremities. No gross focal neurologic deficits are appreciated. Skin: Skin is warm, dry and intact. No rash noted. Psychiatric: Mood and affect are normal. Speech and behavior are normal.  ____________________________________________   LABS (all labs ordered are listed, but only abnormal results are displayed)  Labs Reviewed  CBC  BASIC METABOLIC PANEL   ____________________________________________  EKG  ED ECG REPORT I, Nita Sicklearolina Kinzie Wickes, the attending physician, personally viewed and interpreted this ECG.  Normal sinus rhythm, rate of 84, normal intervals, normal axis, no ST elevations or depressions. Normal EKG.  ____________________________________________  RADIOLOGY  XR Left hip: Subcapital femoral neck fracture on the left with  mild fracture fragment displacement. Bones osteoporotic. Mild symmetric narrowing of both hip joints. No dislocation evident.  CXR; No edema or consolidation. There is aortic atherosclerosis. ____________________________________________   PROCEDURES  Procedure(s) performed: None Procedures Critical Care performed:  None ____________________________________________   INITIAL IMPRESSION / ASSESSMENT AND PLAN / ED COURSE  82 y.o. female with history of Parkinson's disease and dementia who presents for evaluation of left hip pain s/p fall 4 days ago and inability to ambulate since last night and when she heard a pop. X-ray concerning for left femoral neck fracture. Will start patient on IVF, IV fentanyl for pain, get surgical labs, EKG and CXR for medical clearance. Discussed with hospitalist for admission. Dr. Rosita KeaMenz, ortho paged.   Clinical Course    Pertinent labs & imaging results that were available during my care of the patient were reviewed by me and considered in my medical decision making (see chart for details).    ____________________________________________   FINAL CLINICAL IMPRESSION(S) / ED DIAGNOSES  Final diagnoses:  Closed fracture of left hip, initial encounter (HCC)  NEW MEDICATIONS STARTED DURING THIS VISIT:  New Prescriptions   No medications on file     Note:  This document was prepared using Dragon voice recognition software and may include unintentional dictation errors.    Nita Sicklearolina Evin Loiseau, MD 12/21/15 1215

## 2015-12-21 NOTE — Progress Notes (Signed)
Patient was admitted to room 137 from ER via cart and ER, NT. Foley and NSL in place. Right leg marked. SCDs and IV fluids started. Family at bedside. Bed alarm in place.

## 2015-12-22 ENCOUNTER — Inpatient Hospital Stay: Payer: Medicare Other | Admitting: Certified Registered Nurse Anesthetist

## 2015-12-22 ENCOUNTER — Inpatient Hospital Stay: Payer: Medicare Other

## 2015-12-22 ENCOUNTER — Encounter
Admission: EM | Disposition: A | Discharge status: Skilled Nursing Facility | Payer: Self-pay | Source: Home / Self Care | Attending: Internal Medicine

## 2015-12-22 HISTORY — PX: ANTERIOR APPROACH HEMI HIP ARTHROPLASTY: SHX6690

## 2015-12-22 LAB — BASIC METABOLIC PANEL
Anion gap: 5 (ref 5–15)
BUN: 13 mg/dL (ref 6–20)
CO2: 29 mmol/L (ref 22–32)
CREATININE: 0.8 mg/dL (ref 0.44–1.00)
Calcium: 8.4 mg/dL — ABNORMAL LOW (ref 8.9–10.3)
Chloride: 106 mmol/L (ref 101–111)
GFR calc Af Amer: >60 mL/min (ref >60)
Glucose, Bld: 108 mg/dL — ABNORMAL HIGH (ref 65–99)
POTASSIUM: 3.5 mmol/L (ref 3.5–5.1)
SODIUM: 140 mmol/L (ref 135–145)

## 2015-12-22 LAB — CBC
HCT: 29.9 % — ABNORMAL LOW (ref 35.0–47.0)
Hemoglobin: 10.4 g/dL — ABNORMAL LOW (ref 12.0–16.0)
MCH: 31.1 pg (ref 26.0–34.0)
MCHC: 34.8 g/dL (ref 32.0–36.0)
MCV: 89.5 fL (ref 80.0–100.0)
PLATELETS: 107 10*3/uL — AB (ref 150–440)
RBC: 3.35 MIL/uL — ABNORMAL LOW (ref 3.80–5.20)
RDW: 13.5 % (ref 11.5–14.5)
WBC: 5.4 10*3/uL (ref 3.6–11.0)

## 2015-12-22 SURGERY — HEMIARTHROPLASTY, HIP, DIRECT ANTERIOR APPROACH, FOR FRACTURE
Anesthesia: General | Site: Hip | Laterality: Left | Wound class: Clean

## 2015-12-22 MED ORDER — TRANEXAMIC ACID 1000 MG/10ML IV SOLN
INTRAVENOUS | Status: DC | PRN
  Administered 2015-12-22: 1000 mg via INTRAVENOUS

## 2015-12-22 MED ORDER — DEXTROSE 5 % IV SOLN: 1.0000 g | INTRAVENOUS | Status: DC

## 2015-12-22 MED ORDER — NEOMYCIN-POLYMYXIN B GU 40-200000 IR SOLN
Status: DC | PRN
  Administered 2015-12-22: 4 mL

## 2015-12-22 MED ORDER — BUPIVACAINE-EPINEPHRINE 0.25% -1:200000 IJ SOLN
INTRAMUSCULAR | Status: DC | PRN
  Administered 2015-12-22: 30 mL

## 2015-12-22 MED ORDER — METHOCARBAMOL 500 MG PO TABS: 500.0000 mg | ORAL_TABLET | Freq: Four times a day (QID) | ORAL | Status: DC | PRN

## 2015-12-22 MED ORDER — PHENYLEPHRINE HCL 10 MG/ML IJ SOLN
INTRAMUSCULAR | Status: DC | PRN
  Administered 2015-12-22: 100 ug via INTRAVENOUS

## 2015-12-22 MED ORDER — ACETAMINOPHEN 500 MG PO TABS
1000.0000 mg | ORAL_TABLET | Freq: Four times a day (QID) | ORAL | Status: AC
  Administered 2015-12-22 – 2015-12-23 (×3): 1000 mg via ORAL
  Filled 2015-12-22 (×4): qty 2

## 2015-12-22 MED ORDER — SODIUM CHLORIDE 0.9 % IV SOLN
INTRAVENOUS | Status: DC
  Administered 2015-12-22 – 2015-12-23 (×2): via INTRAVENOUS
  Administered 2015-12-24: 100 mL/h via INTRAVENOUS

## 2015-12-22 MED ORDER — ENOXAPARIN SODIUM 30 MG/0.3ML ~~LOC~~ SOLN
30.0000 mg | SUBCUTANEOUS | Status: DC
  Administered 2015-12-23 – 2015-12-24 (×2): 30 mg via SUBCUTANEOUS
  Filled 2015-12-22 (×2): qty 0.3

## 2015-12-22 MED ORDER — ONDANSETRON HCL 4 MG/2ML IJ SOLN: 4.0000 mg | Freq: Once | INTRAMUSCULAR | Status: DC | PRN

## 2015-12-22 MED ORDER — CLINDAMYCIN PHOSPHATE 600 MG/50ML IV SOLN
600.0000 mg | Freq: Four times a day (QID) | INTRAVENOUS | Status: AC
Start: 2015-12-22 — End: 2015-12-23
  Administered 2015-12-22 – 2015-12-23 (×2): 600 mg via INTRAVENOUS
  Filled 2015-12-22 (×2): qty 50

## 2015-12-22 MED ORDER — PHENOL 1.4 % MT LIQD
1.0000 | OROMUCOSAL | Status: DC | PRN
Start: 2015-12-22 — End: 2015-12-25
  Filled 2015-12-22: qty 177

## 2015-12-22 MED ORDER — FENTANYL CITRATE (PF) 100 MCG/2ML IJ SOLN
INTRAMUSCULAR | Status: DC | PRN
  Administered 2015-12-22 (×2): 50 ug via INTRAVENOUS

## 2015-12-22 MED ORDER — MAGNESIUM CITRATE PO SOLN
1.0000 | Freq: Once | ORAL | Status: DC | PRN
  Filled 2015-12-22: qty 296

## 2015-12-22 MED ORDER — FENTANYL CITRATE (PF) 100 MCG/2ML IJ SOLN
25.0000 ug | INTRAMUSCULAR | Status: DC | PRN
  Administered 2015-12-22 (×2): 25 ug via INTRAVENOUS

## 2015-12-22 MED ORDER — DOCUSATE SODIUM 100 MG PO CAPS
100.0000 mg | ORAL_CAPSULE | Freq: Two times a day (BID) | ORAL | Status: DC
  Administered 2015-12-22 – 2015-12-23 (×3): 100 mg via ORAL
  Filled 2015-12-22 (×4): qty 1

## 2015-12-22 MED ORDER — DIPHENHYDRAMINE HCL 12.5 MG/5ML PO ELIX: 12.5000 mg | ORAL_SOLUTION | ORAL | Status: DC | PRN

## 2015-12-22 MED ORDER — MAGNESIUM HYDROXIDE 400 MG/5ML PO SUSP
30.0000 mL | Freq: Every day | ORAL | Status: DC | PRN
  Filled 2015-12-22: qty 30

## 2015-12-22 MED ORDER — PROPOFOL 10 MG/ML IV BOLUS
INTRAVENOUS | Status: DC | PRN
  Administered 2015-12-22: 100 mg via INTRAVENOUS

## 2015-12-22 MED ORDER — BISACODYL 10 MG RE SUPP
10.0000 mg | Freq: Every day | RECTAL | Status: DC | PRN
  Administered 2015-12-24: 10 mg via RECTAL
  Filled 2015-12-22: qty 1

## 2015-12-22 MED ORDER — LACTATED RINGERS IV SOLN
INTRAVENOUS | Status: DC | PRN
  Administered 2015-12-22: 12:00:00 via INTRAVENOUS

## 2015-12-22 MED ORDER — DEXAMETHASONE SODIUM PHOSPHATE 10 MG/ML IJ SOLN
INTRAMUSCULAR | Status: DC | PRN
  Administered 2015-12-22: 5 mg via INTRAVENOUS

## 2015-12-22 MED ORDER — ALUM & MAG HYDROXIDE-SIMETH 200-200-20 MG/5ML PO SUSP: 30.0000 mL | ORAL | Status: DC | PRN

## 2015-12-22 MED ORDER — METHOCARBAMOL 1000 MG/10ML IJ SOLN
500.0000 mg | Freq: Four times a day (QID) | INTRAMUSCULAR | Status: DC | PRN
  Filled 2015-12-22: qty 5

## 2015-12-22 MED ORDER — MENTHOL 3 MG MT LOZG
1.0000 | LOZENGE | OROMUCOSAL | Status: DC | PRN
  Filled 2015-12-22: qty 9

## 2015-12-22 MED ORDER — ONDANSETRON HCL 4 MG/2ML IJ SOLN
INTRAMUSCULAR | Status: DC | PRN
  Administered 2015-12-22: 4 mg via INTRAVENOUS

## 2015-12-22 MED ORDER — MIDAZOLAM HCL 2 MG/2ML IJ SOLN
INTRAMUSCULAR | Status: DC | PRN
  Administered 2015-12-22: 1 mg via INTRAVENOUS

## 2015-12-22 MED ORDER — CIPROFLOXACIN IN D5W 400 MG/200ML IV SOLN
400.0000 mg | Freq: Two times a day (BID) | INTRAVENOUS | Status: DC
  Administered 2015-12-22 – 2015-12-24 (×5): 400 mg via INTRAVENOUS
  Filled 2015-12-22 (×6): qty 200

## 2015-12-22 MED ORDER — LACTATED RINGERS IV SOLN
INTRAVENOUS | Status: DC
  Administered 2015-12-22: 12:00:00 via INTRAVENOUS

## 2015-12-22 MED ORDER — TRAMADOL HCL 50 MG PO TABS: 50.0000 mg | ORAL_TABLET | Freq: Four times a day (QID) | ORAL | Status: DC | PRN

## 2015-12-22 SURGICAL SUPPLY — 48 items
BLADE SAW SAG 18.5X105 (BLADE) ×2 IMPLANT
BNDG COHESIVE 6X5 TAN STRL LF (GAUZE/BANDAGES/DRESSINGS) ×4 IMPLANT
CANISTER SUCT 1200ML W/VALVE (MISCELLANEOUS) ×2 IMPLANT
CAPT HIP HEMI 2 ×1 IMPLANT
CATH FOL LEG HOLDER (MISCELLANEOUS) ×1 IMPLANT
CATH TRAY METER 16FR LF (MISCELLANEOUS) ×1 IMPLANT
CHLORAPREP W/TINT 26ML (MISCELLANEOUS) ×2 IMPLANT
DRAPE C-ARM XRAY 36X54 (DRAPES) ×2 IMPLANT
DRAPE INCISE IOBAN 66X60 STRL (DRAPES) IMPLANT
DRAPE POUCH INSTRU U-SHP 10X18 (DRAPES) ×2 IMPLANT
DRAPE SHEET LG 3/4 BI-LAMINATE (DRAPES) ×6 IMPLANT
DRAPE STERI IOBAN 125X83 (DRAPES) ×2 IMPLANT
DRAPE TABLE BACK 80X90 (DRAPES) ×2 IMPLANT
DRSG OPSITE POSTOP 4X8 (GAUZE/BANDAGES/DRESSINGS) ×3 IMPLANT
ELECT BLADE 6.5 EXT (BLADE) ×2 IMPLANT
GAUZE SPONGE 4X4 12PLY STRL (GAUZE/BANDAGES/DRESSINGS) ×2 IMPLANT
GLOVE BIOGEL PI IND STRL 9 (GLOVE) ×1 IMPLANT
GLOVE BIOGEL PI INDICATOR 9 (GLOVE) ×1
GLOVE SURG SYN 9.0  PF PI (GLOVE) ×2
GLOVE SURG SYN 9.0 PF PI (GLOVE) ×2 IMPLANT
GOWN SRG 2XL LVL 4 RGLN SLV (GOWNS) ×1 IMPLANT
GOWN STRL NON-REIN 2XL LVL4 (GOWNS) ×2
GOWN STRL REUS W/ TWL LRG LVL3 (GOWN DISPOSABLE) ×1 IMPLANT
GOWN STRL REUS W/TWL LRG LVL3 (GOWN DISPOSABLE) ×2
HEMOVAC 400CC 10FR (MISCELLANEOUS) ×1 IMPLANT
HOOD PEEL AWAY FLYTE STAYCOOL (MISCELLANEOUS) ×2 IMPLANT
MAT BLUE FLOOR 46X72 FLO (MISCELLANEOUS) ×2 IMPLANT
NDL SAFETY 18GX1.5 (NEEDLE) ×2 IMPLANT
NDL SPNL 18GX3.5 QUINCKE PK (NEEDLE) ×1 IMPLANT
NEEDLE SPNL 18GX3.5 QUINCKE PK (NEEDLE) ×2 IMPLANT
NS IRRIG 1000ML POUR BTL (IV SOLUTION) ×2 IMPLANT
PACK HIP COMPR (MISCELLANEOUS) ×2 IMPLANT
SOL PREP PVP 2OZ (MISCELLANEOUS) ×2
SOLUTION PREP PVP 2OZ (MISCELLANEOUS) ×1 IMPLANT
STAPLER SKIN PROX 35W (STAPLE) ×2 IMPLANT
STRAP SAFETY BODY (MISCELLANEOUS) ×2 IMPLANT
SUT DVC 2 QUILL PDO  T11 36X36 (SUTURE) ×1
SUT DVC 2 QUILL PDO T11 36X36 (SUTURE) ×1 IMPLANT
SUT DVC QUILL MONODERM 30X30 (SUTURE) ×2 IMPLANT
SUT ETHIBOND CT1 BRD #0 30IN (SUTURE) ×1 IMPLANT
SUT SILK 0 (SUTURE) ×2
SUT SILK 0 30XBRD TIE 6 (SUTURE) ×1 IMPLANT
SUT VIC AB 1 CT1 36 (SUTURE) ×2 IMPLANT
SYR 20CC LL (SYRINGE) ×2 IMPLANT
SYR 30ML LL (SYRINGE) ×2 IMPLANT
TAPE MICROFOAM 4IN (TAPE) ×1 IMPLANT
TOWEL OR 17X26 4PK STRL BLUE (TOWEL DISPOSABLE) ×1 IMPLANT
TUBE KAMVAC SUCTION (TUBING) ×2 IMPLANT

## 2015-12-22 NOTE — Progress Notes (Signed)
Pt has been ordered lovenox 40mg  q 24hr. Pt is a female with a weight <45kg, therefore will be transitioned to 30mg  daily per protocol.  Olene FlossMelissa D Jamariah Tony, Pharm.D Clinical Pharmacist

## 2015-12-22 NOTE — Op Note (Signed)
12/21/2015 - 12/22/2015  1:39 PM  PATIENT:  Allison Pollard  82 y.o. female  PRE-OPERATIVE DIAGNOSIS:  Left Femoral Neck Fracture   POST-OPERATIVE DIAGNOSIS:  Left Femoral Neck Fracture   PROCEDURE:  Procedure(s): ANTERIOR APPROACH HEMI HIP ARTHROPLASTY (Left)  SURGEON: Leitha SchullerMichael J Maytal Mijangos, MD  ASSISTANTS: None  ANESTHESIA:   general  EBL:  Total I/O In: -  Out: 750 [Urine:450; Blood:300]  BLOOD ADMINISTERED:none  DRAINS: none   LOCAL MEDICATIONS USED:  MARCAINE     SPECIMEN:  Source of Specimen:  Left femoral head  DISPOSITION OF SPECIMEN:  PATHOLOGY  COUNTS:  YES  TOURNIQUET:  * No tourniquets in log *  IMPLANTS: Medacta AMIS standard 3 stem with M 28 mm head and 46 bipolar head  DICTATION: .Dragon Dictation   The patient was brought to the operating room and after general anesthesia was obtained patient was placed on the operative table with the ipsilateral foot into the Medacta attachment, contralateral leg on a well-padded table. C-arm was brought in and preop template x-ray taken. After prepping and draping in usual sterile fashion appropriate patient identification and timeout procedures were completed. Anterior approach to the hip was obtained and centered over the greater trochanter and TFL muscle. The subcutaneous tissue was incised hemostasis being achieved by electrocautery. TFL fascia was incised and the muscle retracted laterally deep retractor placed. The lateral femoral circumflex vessels were identified and ligated. The anterior capsule was exposed and a capsulotomy performed. The neck was identified and a femoral neck cut carried out with a saw. The head was removed without difficulty and the fracture was quite displaced with the proximal more distal cut to the neck to get a level cut for the implant. The leg was then externally rotated and ischiofemoral and pubofemoral releases carried out. The femur was sequentially broached to a size 3 and 46 mm bipolar trial in  the cup appeared to be the appropriate size and the final components chosen. The 3 standard stem was inserted along with a M 28 mm head and 46 mm bipolar head. The hip was reduced and was stable the wound was thoroughly irrigatedn. The deep fascia was closed using a heavy Quill after infiltration of 30 cc of quarter percent Sensorcaine with epinephrine. TXA was injected into the joint   3-0 V-lock to close the skin with skin staples Xeroform and honeycomb dressing applied   PLAN OF CARE: Continue as inpatient

## 2015-12-22 NOTE — OR Nursing (Signed)
Unable to remove ring tape applied will report to OR nurse.

## 2015-12-22 NOTE — Progress Notes (Addendum)
[facility] [place] [facility]   PATIENT NAME: Allison RossettiBarbara Pollard    MR#:  [id number]  DATE OF BIRTH:  09/04/1933  SUBJECTIVE:  Seen in PACU. Just got back from left hip surgery  REVIEW OF SYSTEMS:   Review of Systems  Unable to perform ROS: Mental acuity   Tolerating Diet:post-op Tolerating PT: pending  DRUG ALLERGIES:   Allergies  Allergen Reactions  . Buspirone Other (See Comments)    UTI and Shakiness  . Citalopram Other (See Comments)    Severe Confusion  . Clonazepam Other (See Comments)    Panic attacks and paranoid  . Hydroxyzine Hives  . Sertraline Other (See Comments)    parkinson's type shaking  . Codeine Rash  . Hydrocodone Rash  . Neomycin Rash  . Oxycodone Rash  . Penicillins Rash    Has patient had a PCN reaction causing immediate rash, facial/tongue/throat swelling, SOB or lightheadedness with hypotension: Yes Has patient had a PCN reaction causing severe rash involving mucus membranes or skin necrosis: No Has patient had a PCN reaction that required hospitalization No Has patient had a PCN reaction occurring within the last 10 years: No If all of the above answers are "NO", then may proceed with Cephalosporin use.  . Sulfa Antibiotics Rash    VITALS:  Blood pressure 134/87, pulse 94, temperature 97.7 F (36.5 C), resp. rate 16, height 5\' 3"  (1.6 m), weight 44.5 kg (98 lb), SpO2 100 %.  PHYSICAL EXAMINATION:   Physical Exam  GENERAL:  82 y.o.-year-old patient lying in the bed with no acute distress. Thin, cachectic EYES: Pupils equal, round, reactive to light and accommodation. No scleral icterus. Extraocular muscles intact.  HEENT: Head atraumatic, normocephalic. Oropharynx and nasopharynx clear.  NECK:  Supple, no jugular venous distention. No thyroid enlargement, no tenderness.  LUNGS: Normal breath sounds bilaterally, no wheezing, rales, rhonchi. No use of accessory muscles of respiration.  CARDIOVASCULAR: S1, S2  normal. No murmurs, rubs, or gallops.  ABDOMEN: Soft, nontender, nondistended. Bowel sounds present. No organomegaly or mass.  EXTREMITIES: No cyanosis, clubbing or edema b/l.    NEUROLOGIC: seen in PACU, grossly non focal PSYCHIATRIC:  patient is alert and oriented x1.  SKIN: No obvious rash, lesion, or ulcer.   LABORATORY PANEL:  CBC  Recent Labs Lab 12/22/15 0329  WBC 5.4  HGB 10.4*  HCT 29.9*  PLT 107*    Chemistries   Recent Labs Lab 12/22/15 0329  NA 140  K 3.5  CL 106  CO2 29  GLUCOSE 108*  BUN 13  CREATININE 0.80  CALCIUM 8.4*   Cardiac Enzymes No results for input(s): TROPONINI in the last 168 hours. RADIOLOGY:  Dg Chest 1 View  Result Date: 12/21/2015 CLINICAL DATA:  Pain following fall EXAM: CHEST 1 VIEW COMPARISON:  August 09, 2011 FINDINGS: There is no edema or consolidation. The heart size and pulmonary vascularity are normal. No adenopathy. There is atherosclerotic calcification in the aorta. There is thoracolumbar levoscoliosis. IMPRESSION: No edema or consolidation.  There is aortic atherosclerosis. Electronically Signed   By: Bretta BangWilliam  Woodruff III M.D.   On: 12/21/2015 11:52   Dg Hip Operative Unilat W Or W/o Pelvis Left  Result Date: 12/22/2015 CLINICAL DATA:  Left hip fracture EXAM: OPERATIVE LEFT HIP WITH PELVIS COMPARISON:  None. FLUOROSCOPY TIME:  Radiation Exposure Index (as provided by the fluoroscopic device): Not available If the device does not provide the exposure index: Fluoroscopy Time:  6 seconds Number of Acquired Images:  2 FINDINGS: Initial film again demonstrates a subcapital femoral neck fracture. Subsequent image shows a left hip replacement in satisfactory position. No acute abnormality is noted. IMPRESSION: Status post left hip replacement Electronically Signed   By: Alcide CleverMark  Lukens M.D.   On: 12/22/2015 13:37   Dg Hip Unilat W Or W/o Pelvis 2-3 Views Left  Result Date: 12/22/2015 CLINICAL DATA:  Post LEFT total hip replacement, postop  pain EXAM: DG HIP (WITH OR WITHOUT PELVIS) 2-3V LEFT COMPARISON:  12/21/2015 FINDINGS: LEFT hip prosthesis identified. Bones demineralized. No acute fracture, dislocation or bone destruction. Soft tissue swelling, minimal soft tissue gas and skin clips are seen overlying surgical region. IMPRESSION: LEFT hip prosthesis without acute complication. Electronically Signed   By: Ulyses SouthwardMark  Boles M.D.   On: 12/22/2015 14:22   Dg Hip Unilat With Pelvis 2-3 Views Left  Result Date: 12/21/2015 CLINICAL DATA:  Pain following fall EXAM: DG HIP (WITH OR WITHOUT PELVIS) 2-3V LEFT COMPARISON:  None. FINDINGS: Frontal pelvis as well as frontal and lateral left hip images were obtained it. There is a fracture of the subcapital left femoral neck. There is superior displacement of the femoral shaft with respect to the femoral head. No other fracture evident. No dislocation. Bones are osteoporotic. There is mild symmetric narrowing of both hip joints. IMPRESSION: Subcapital femoral neck fracture on the left with mild fracture fragment displacement. Bones osteoporotic. Mild symmetric narrowing of both hip joints. No dislocation evident. Electronically Signed   By: Bretta BangWilliam  Woodruff III M.D.   On: 12/21/2015 11:53   ASSESSMENT AND PLAN:  82 year old female with past medical history of Parkinson's disease, dementia who presents to the hospital after a mechanical fall and noted to have a left hip fracture.  1. Left hip fracture-secondary to a mechanical fall. S/p hip surgery POD 0 -Continue pain control and care as per orthopedics who have been consulted.  2. History of Parkinson's disease-continue Sinemet.  3. History of hyperthyroidism-continue Tapazole.  4. Dementnia-continue Aricept.  5. Depression - cont. Seroquel, Trazodone.  6. UA on admission looks abnormal. Given periop period will cover with abxs. UC sent if neg then d/c abxs  Case discussed with Care Management/Social Worker. Management plans discussed  with the patient, family and they are in agreement.  CODE STATUS: full DVT Prophylaxis: lovenox TOTAL TIME TAKING CARE OF THIS PATIENT: 30 minutes.  >50% time spent on counselling and coordination of care  POSSIBLE D/C IN 2-3DAYS, DEPENDING ON CLINICAL CONDITION.  Note: This dictation was prepared with Dragon dictation along with smaller phrase technology. Any transcriptional errors that result from this process are unintentional.  Mija Effertz M.D on 12/22/2015 at 2:58 PM  Between 7am to 6pm - Pager - [phone]  After 6pm go to [email or web address] - password [email or web address] [facility]  [facility]  Office  [phone]  CC: Primary care physician; HART, Charlton AmorLAURA CHRISTINE, MD

## 2015-12-22 NOTE — Progress Notes (Signed)
Initial Nutrition Assessment  DOCUMENTATION CODES:   Severe malnutrition in context of chronic illness  INTERVENTION:  Ensure strawberry mixed with Ice Cream BID to create a milkshake w/ advancement  NUTRITION DIAGNOSIS:   Malnutrition related to chronic illness as evidenced by severe depletion of muscle mass, severe depletion of body fat.  GOAL:   Patient will meet greater than or equal to 90% of their needs  MONITOR:   PO intake, I & O's, Labs, Weight trends, Supplement acceptance  REASON FOR ASSESSMENT:   Consult Hip fracture protocol  ASSESSMENT:   Allison Pollard  is a 82 y.o. female with a known history of Parkinson's disease, dementia who presented to the hospital due to left hip pain and difficulty walking. Patient had a fall at home 4 days ago but was able to get herself up and ambulate.   Spoke with patient's daughter at bedside. She endorses pt not eating well for several months now up to the point where they have put her on "something to stimulate her appetite" per daughter. I could not find evidence of this in her home meds; noted patient is on seroquel which does have some appetite stimulating side effects to it. Daughter stated patient was 112# a few months ago and was down to 93# upon her most recent PCP visit prior to admission. Per chart she exhibits a 7#/6.7% severe wt loss over 2 months. Nutrition-Focused physical exam completed. Findings are severe fat depletion, severe muscle depletion, and no edema.  She is NPO currently Patient did not eat anything for dinner last night, apparently just picks at her food Daughter states she also needs to be cued to chew/swallow, but otherwise has no issues. She likes Milkshakes mixed with Ensure, will arrange for Kitchen to provide. Labs and medications reviewed.   Diet Order:  Diet NPO time specified  Skin:  Reviewed, no issues  Last BM:  12/21/2015  Height:   Ht Readings from Last 1 Encounters:  12/21/15 5\' 3"   (1.6 m)    Weight:   Wt Readings from Last 1 Encounters:  12/21/15 98 lb (44.5 kg)    Ideal Body Weight:  52.27 kg  BMI:  Body mass index is 17.36 kg/m.  Estimated Nutritional Needs:   Kcal:  1350-1550 calories  Protein:  45-55 gm  Fluid:  >/= 1.3L  EDUCATION NEEDS:   No education needs identified at this time  Allison AnoWilliam M. Andersson [place], MS, RD LDN Inpatient Clinical Dietitian Pager [phone]

## 2015-12-22 NOTE — Anesthesia Procedure Notes (Signed)
Procedure Name: LMA Insertion Date/Time: 12/22/2015 12:37 PM Performed by: Marlana SalvageJESSUP, Sonyia Muro Pre-anesthesia Checklist: Patient identified, Emergency Drugs available, Suction available, Patient being monitored and Timeout performed Patient Re-evaluated:Patient Re-evaluated prior to inductionOxygen Delivery Method: Circle system utilized Preoxygenation: Pre-oxygenation with 100% oxygen Intubation Type: IV induction Ventilation: Mask ventilation without difficulty LMA: LMA inserted LMA Size: 4.0 Number of attempts: 1 Placement Confirmation: positive ETCO2 and breath sounds checked- equal and bilateral Dental Injury: Teeth and Oropharynx as per pre-operative assessment

## 2015-12-22 NOTE — Anesthesia Preprocedure Evaluation (Signed)
Anesthesia Evaluation  Patient identified by MRN, date of birth, ID band Patient awake    Reviewed: Allergy & Precautions, H&P , NPO status , Patient's Chart, lab work & pertinent test results, reviewed documented beta blocker date and time   Airway Mallampati: II   Neck ROM: full    Dental  (+) Poor Dentition   Pulmonary neg pulmonary ROS, former smoker,    Pulmonary exam normal        Cardiovascular negative cardio ROS Normal cardiovascular exam     Neuro/Psych PSYCHIATRIC DISORDERS negative neurological ROS  negative psych ROS   GI/Hepatic negative GI ROS, Neg liver ROS,   Endo/Other  negative endocrine ROS  Renal/GU negative Renal ROS  negative genitourinary   Musculoskeletal   Abdominal   Peds  Hematology negative hematology ROS (+)   Anesthesia Other Findings Past Medical History: No date: Dementia No date: Parkinson disease (HCC) Past Surgical History: No date: CHOLECYSTECTOMY BMI    Body Mass Index:  17.36 kg/m     Reproductive/Obstetrics                             Anesthesia Physical Anesthesia Plan  ASA: III  Anesthesia Plan: Spinal   Post-op Pain Management:    Induction:   Airway Management Planned:   Additional Equipment:   Intra-op Plan:   Post-operative Plan:   Informed Consent: I have reviewed the patients History and Physical, chart, labs and discussed the procedure including the risks, benefits and alternatives for the proposed anesthesia with the patient or authorized representative who has indicated his/her understanding and acceptance.   Dental Advisory Given  Plan Discussed with: CRNA  Anesthesia Plan Comments:         Anesthesia Quick Evaluation

## 2015-12-22 NOTE — Progress Notes (Signed)
Social work Theatre manager met with patient and patient's daughter at bedside. Patient was laying in bed. Social work Theatre manager presented bed offers to patient's daughter. Patient's daughter would like patient to go to [facility] upon discharge. [facility] admissions coordinator at [facility] is aware of accepted bed offer. Social work Theatre manager will continue to follow and assist as needed.  Danie Chandler, Social Work Intern [phone]

## 2015-12-22 NOTE — Clinical Social Work Placement (Signed)
   CLINICAL SOCIAL WORK PLACEMENT  NOTE  Date:  12/22/2015  Patient Details  Name: Gilford RileBarbara H Villafranca MRN: [id number] Date of Birth: [birth year]  Clinical Social Work is seeking post-discharge placement for this patient at the Skilled  Nursing Facility level of care (*CSW will initial, date and re-position this form in  chart as items are completed):  Yes   Patient/family provided with [facility] [place] Clinical Social Work Department's list of facilities offering this level of care within the geographic area requested by the patient (or if unable, by the patient's family).  Yes   Patient/family informed of their freedom to choose among providers that offer the needed level of care, that participate in Medicare, Medicaid or managed care program needed by the patient, have an available bed and are willing to accept the patient.  Yes   Patient/family informed of [facility]'s ownership interest in [facility] and [facility], as well as of the fact that they are under no obligation to receive care at these facilities.  PASRR submitted to EDS on 12/21/15     PASRR number received on 12/21/15     Existing PASRR number confirmed on       FL2 transmitted to all facilities in geographic area requested by pt/family on 12/21/15     FL2 transmitted to all facilities within larger geographic area on       Patient informed that his/her managed care company has contracts with or will negotiate with certain facilities, including the following:        Yes   Patient/family informed of bed offers received.  Patient chooses bed at  [facility]     Physician recommends and patient chooses bed at      Patient to be transferred to   on  .  Patient to be transferred to facility by       Patient family notified on   of transfer.  Name of family member notified:        PHYSICIAN       Additional Comment:    _______________________________________________ Equilla Que, Darleen CrockerBailey M,  LCSW 12/22/2015, 4:21 PM

## 2015-12-22 NOTE — Transfer of Care (Signed)
Immediate Anesthesia Transfer of Care Note  Patient: Allison Pollard  Procedure(s) Performed: Procedure(s): ANTERIOR APPROACH HEMI HIP ARTHROPLASTY (Left)  Patient Location: PACU  Anesthesia Type:General  Level of Consciousness: patient cooperative and responds to stimulation  Airway & Oxygen Therapy: Patient Spontanous Breathing and Patient connected to nasal cannula oxygen  Post-op Assessment: Report given to RN and Post -op Vital signs reviewed and stable  Post vital signs: Reviewed and stable  Last Vitals:  Vitals:   12/22/15 1121 12/22/15 1347  BP: 134/76 139/84  Pulse: 97 90  Resp: 14 13  Temp: 36.9 C 36.2 C    Last Pain:  Vitals:   12/22/15 1347  TempSrc:   PainSc: Asleep         Complications: No apparent anesthesia complications

## 2015-12-23 ENCOUNTER — Encounter: Payer: Self-pay | Admitting: Orthopedic Surgery

## 2015-12-23 LAB — BASIC METABOLIC PANEL
Anion gap: 5 (ref 5–15)
BUN: 13 mg/dL (ref 6–20)
CHLORIDE: 106 mmol/L (ref 101–111)
CO2: 28 mmol/L (ref 22–32)
CREATININE: 0.75 mg/dL (ref 0.44–1.00)
Calcium: 7.8 mg/dL — ABNORMAL LOW (ref 8.9–10.3)
GFR calc Af Amer: >60 mL/min (ref >60)
GFR calc non Af Amer: >60 mL/min (ref >60)
GLUCOSE: 154 mg/dL — AB (ref 65–99)
Potassium: 4 mmol/L (ref 3.5–5.1)
SODIUM: 139 mmol/L (ref 135–145)

## 2015-12-23 LAB — CBC
HEMATOCRIT: 30.9 % — AB (ref 35.0–47.0)
Hemoglobin: 10.6 g/dL — ABNORMAL LOW (ref 12.0–16.0)
MCH: 31 pg (ref 26.0–34.0)
MCHC: 34.4 g/dL (ref 32.0–36.0)
MCV: 90.2 fL (ref 80.0–100.0)
PLATELETS: 124 10*3/uL — AB (ref 150–440)
RBC: 3.42 MIL/uL — ABNORMAL LOW (ref 3.80–5.20)
RDW: 13.2 % (ref 11.5–14.5)
WBC: 9.3 10*3/uL (ref 3.6–11.0)

## 2015-12-23 MED ORDER — TRAMADOL HCL 50 MG PO TABS: 50.0000 mg | ORAL_TABLET | Freq: Four times a day (QID) | ORAL | 0 refills | Status: DC | PRN

## 2015-12-23 MED ORDER — DOCUSATE SODIUM 100 MG PO CAPS: 100.0000 mg | ORAL_CAPSULE | Freq: Two times a day (BID) | ORAL | 0 refills | Status: AC

## 2015-12-23 MED ORDER — CIPROFLOXACIN HCL 500 MG PO TABS: 500.0000 mg | ORAL_TABLET | Freq: Two times a day (BID) | ORAL | 0 refills | Status: AC

## 2015-12-23 MED ORDER — ACETAMINOPHEN 325 MG PO TABS: 650.0000 mg | ORAL_TABLET | Freq: Four times a day (QID) | ORAL | 0 refills | Status: AC | PRN

## 2015-12-23 MED ORDER — ENOXAPARIN SODIUM 30 MG/0.3ML ~~LOC~~ SOLN: 30.0000 mg | SUBCUTANEOUS | 0 refills | Status: DC

## 2015-12-23 MED ORDER — METHOCARBAMOL 500 MG PO TABS: 500.0000 mg | ORAL_TABLET | Freq: Four times a day (QID) | ORAL | 0 refills | Status: AC | PRN

## 2015-12-23 NOTE — Evaluation (Addendum)
Occupational Therapy Evaluation Patient Details Name: Allison RileBarbara H Pollard MRN: [id number] DOB: [birth year] Today's Date: 12/23/2015    History of Present Illness Pt. is an 82 y.o. female was [facility] for Hemiarthroplasty repair of a Left Hip Fx.    Clinical Impression   Pt. Is an 82 y.o. female who [facility] Hemiarthroplasty repair of a Left Hip Fracture. Pt. presents with weakness, pain, and decreased functional mobility which hinder her ability to complete ADLs. Pt. could benefit from skilled OT services for ADL training, A/E training, UE therapeutic Ex, and pt. Education about home modification, and work simplification strategies.    Follow Up Recommendations  SNF    Equipment Recommendations       Recommendations for Other Services       Precautions / Restrictions Precautions Precautions: Fall Precaution Comments: Direct anterior, no hip precautions.  Spoke with Dr. Clint GuyHower over the phone prior to this evaluation who confirmed pt may get OOB and ambulate and to disregard current activity orders of Bed rest with bathroom priviledges. Restrictions Weight Bearing Restrictions: Yes LLE Weight Bearing: Weight bearing as tolerated              ADL Overall ADL's : Needs assistance/impaired Eating/Feeding: Set up   Grooming: Set up               Lower Body Dressing: Maximal assistance                 General ADL Comments: Pt. education was provided about A/E use for LE ADLs.     Vision     Perception     Praxis      Pertinent Vitals/Pain Pain Assessment: 0-10 Pain Score: 5  Faces Pain Scale: Hurts even more Pain Location: L hip with exercises and ambulation Pain Descriptors / Indicators: Grimacing;Moaning Pain Intervention(s): Limited activity within patient's tolerance;Monitored during session;Repositioned     Hand Dominance Right   Extremity/Trunk Assessment Upper Extremity Assessment Upper Extremity Assessment: Overall  WFL for tasks assessed     Communication Communication Communication: No difficulties   Cognition Arousal/Alertness: Awake/alert Behavior During Therapy: WFL for tasks assessed/performed Overall Cognitive Status: History of cognitive impairments - at baseline                     General Comments       Exercises     Shoulder Instructions      Home Living Family/patient expects to be discharged to:: Private residence Living Arrangements: Alone Available Help at Discharge: Family Type of Home: House Home Access: Stairs to enter [employer] Corporation[employer]trance Stairs-Number of Steps: 5 in front, 10 in back   Home Layout: Two level     Bathroom Shower/Tub: Walk-in shower         Home Equipment: Shower seat   Additional Comments: Family not present to provide pt. history information, or home layout information.               OT Problem List: Decreased strength;Decreased activity tolerance;Decreased knowledge of precautions;Pain;Decreased coordination;Decreased knowledge of use of DME or AE;Decreased safety awareness   OT Treatment/Interventions: Self-care/ADL training;Therapeutic exercise;Therapeutic activities;DME and/or AE instruction    OT Goals(Current goals can be found in the care plan section) Acute Rehab OT Goals Patient Stated Goal: decreased pain ADL Goals Pt Will Perform Lower Body Dressing: with modified independence Pt Will Transfer to Toilet: with modified independence  OT Frequency: Min 1X/week   Barriers to D/C:  Co-evaluation              End of Session    Activity Tolerance: Patient tolerated treatment well Patient left: in chair;with call bell/phone within reach;with chair alarm set   Time: 1050-1113 OT Time Calculation (min): 23 min Charges:  OT General Charges $OT Visit: 1 Procedure OT Evaluation $OT Eval Moderate Complexity: 1 Procedure G-Codes:    Olegario MessierElaine Meir [place], MS, OTR/L 12/23/2015, 12:28 PM

## 2015-12-23 NOTE — Progress Notes (Signed)
[facility] [place] at [facility]   PATIENT NAME: Allison RossettiBarbara Tison    MRN#:  [id number]  DATE OF BIRTH:  06-25-1933  SUBJECTIVE:  Hospital Day: 2 days Allison Pollard is a 82 y.o. female presenting with Hip Pain .   Overnight events: No acute overnight events Interval Events: No complaints pain well controlled  REVIEW OF SYSTEMS:  CONSTITUTIONAL: No fever, fatigue or weakness.  EYES: No blurred or double vision.  EARS, NOSE, AND THROAT: No tinnitus or ear pain.  RESPIRATORY: No cough, shortness of breath, wheezing or hemoptysis.  CARDIOVASCULAR: No chest pain, orthopnea, edema.  GASTROINTESTINAL: No nausea, vomiting, diarrhea or abdominal pain.  GENITOURINARY: No dysuria, hematuria.  ENDOCRINE: No polyuria, nocturia,  HEMATOLOGY: No anemia, easy bruising or bleeding SKIN: No rash or lesion. MUSCULOSKELETAL: No joint pain or arthritis.   NEUROLOGIC: No tingling, numbness, weakness.  PSYCHIATRY: No anxiety or depression.   DRUG ALLERGIES:   Allergies  Allergen Reactions  . Buspirone Other (See Comments)    UTI and Shakiness  . Citalopram Other (See Comments)    Severe Confusion  . Clonazepam Other (See Comments)    Panic attacks and paranoid  . Hydroxyzine Hives  . Sertraline Other (See Comments)    parkinson's type shaking  . Codeine Rash  . Hydrocodone Rash  . Neomycin Rash  . Oxycodone Rash  . Penicillins Rash    Has patient had a PCN reaction causing immediate rash, facial/tongue/throat swelling, SOB or lightheadedness with hypotension: Yes Has patient had a PCN reaction causing severe rash involving mucus membranes or skin necrosis: No Has patient had a PCN reaction that required hospitalization No Has patient had a PCN reaction occurring within the last 10 years: No If all of the above answers are "NO", then may proceed with Cephalosporin use.  . Sulfa Antibiotics Rash    VITALS:  Blood pressure 110/68, pulse 81, temperature 97.5 F  (36.4 C), temperature source Oral, resp. rate 16, height 5\' 3"  (1.6 m), weight 44.5 kg (98 lb), SpO2 100 %.  PHYSICAL EXAMINATION:  VITAL SIGNS: Vitals:   12/23/15 0805 12/23/15 1119  BP: 130/73 110/68  Pulse: 87 81  Resp:  16  Temp: 97.6 F (36.4 C) 97.5 F (36.4 C)   GENERAL:82 y.o.female currently in no acute distress.  HEAD: Normocephalic, atraumatic.  EYES: Pupils equal, round, reactive to light. Extraocular muscles intact. No scleral icterus.  MOUTH: Moist mucosal membrane. Dentition intact. No abscess noted.  EAR, NOSE, THROAT: Clear without exudates. No external lesions.  NECK: Supple. No thyromegaly. No nodules. No JVD.  PULMONARY: Clear to ascultation, without wheeze rails or rhonci. No use of accessory muscles, Good respiratory effort. good air entry bilaterally CHEST: Nontender to palpation.  CARDIOVASCULAR: S1 and S2. Regular rate and rhythm. No murmurs, rubs, or gallops. No edema. Pedal pulses 2+ bilaterally.  GASTROINTESTINAL: Soft, nontender, nondistended. No masses. Positive bowel sounds. No hepatosplenomegaly.  MUSCULOSKELETAL: No swelling, clubbing, or edema. Range of motion full in all extremities.  NEUROLOGIC: Cranial nerves II through XII are intact. No gross focal neurological deficits. Sensation intact. Reflexes intact.  SKIN: No ulceration, lesions, rashes, or cyanosis. Skin warm and dry. Turgor intact.  PSYCHIATRIC: Mood, affect Flat. The patient is awake, Pleasantly confused. Insight, judgment intact.      LABORATORY PANEL:   CBC  Recent Labs Lab 12/23/15 0257  WBC 9.3  HGB 10.6*  HCT 30.9*  PLT 124*   ------------------------------------------------------------------------------------------------------------------  Chemistries   Recent Labs Lab 12/23/15  0257  NA 139  K 4.0  CL 106  CO2 28  GLUCOSE 154*  BUN 13  CREATININE 0.75  CALCIUM 7.8*    ------------------------------------------------------------------------------------------------------------------  Cardiac Enzymes No results for input(s): TROPONINI in the last 168 hours. ------------------------------------------------------------------------------------------------------------------  RADIOLOGY:  Dg Hip Operative Unilat W Or W/o Pelvis Left  Result Date: 12/22/2015 CLINICAL DATA:  Left hip fracture EXAM: OPERATIVE LEFT HIP WITH PELVIS COMPARISON:  None. FLUOROSCOPY TIME:  Radiation Exposure Index (as provided by the fluoroscopic device): Not available If the device does not provide the exposure index: Fluoroscopy Time:  6 seconds Number of Acquired Images:  2 FINDINGS: Initial film again demonstrates a subcapital femoral neck fracture. Subsequent image shows a left hip replacement in satisfactory position. No acute abnormality is noted. IMPRESSION: Status post left hip replacement Electronically Signed   By: Alcide CleverMark  Lukens M.D.   On: 12/22/2015 13:37   Dg Hip Unilat W Or W/o Pelvis 2-3 Views Left  Result Date: 12/22/2015 CLINICAL DATA:  Post LEFT total hip replacement, postop pain EXAM: DG HIP (WITH OR WITHOUT PELVIS) 2-3V LEFT COMPARISON:  12/21/2015 FINDINGS: LEFT hip prosthesis identified. Bones demineralized. No acute fracture, dislocation or bone destruction. Soft tissue swelling, minimal soft tissue gas and skin clips are seen overlying surgical region. IMPRESSION: LEFT hip prosthesis without acute complication. Electronically Signed   By: Ulyses SouthwardMark  Boles M.D.   On: 12/22/2015 14:22    EKG:   Orders placed or performed during the hospital encounter of 12/21/15  . ED EKG  . ED EKG  . EKG 12-Lead  . EKG 12-Lead    ASSESSMENT AND PLAN:   Allison RossettiBarbara Conlee is a 82 y.o. female presenting with Hip Pain . Admitted 12/21/2015 : Day #: 2 days   1. Postop day 1 left hip fracture status post repair, pain control bowel regimen physical therapy, placement 2. Parkinson Sinemet 3.  Hyperthyroidism unspecified Tapazole 4. Dementia Aricept 5. UTIs Cipro   All the records are reviewed and case discussed with Care Management/Social Workerr. Management plans discussed with the patient, family and they are in agreement.  CODE STATUS: full TOTAL TIME TAKING CARE OF THIS PATIENT: 28 minutes.   POSSIBLE D/C IN 1DAYS, DEPENDING ON CLINICAL CONDITION.   Lameshia Hypolite,  Mardi MainlandDavid K M.D on 12/23/2015 at 12:00 PM  Between 7am to 6pm - Pager - [phone]  After 6pm: House Pager: - [phone]  [facility]  Office  [phone]  CC: Primary care physician; HART, Charlton AmorLAURA CHRISTINE, MD

## 2015-12-23 NOTE — Discharge Summary (Signed)
[facility] - [place] [facility]   PATIENT NAME: Allison Pollard    MR#:  [id number]  DATE OF BIRTH:  [birth year]  DATE OF ADMISSION:  12/21/2015 ADMITTING PHYSICIAN: Kennedy BuckerMichael Menz, MD  DATE OF DISCHARGE: 12/23/15  PRIMARY CARE PHYSICIAN: HART, Charlton AmorLAURA CHRISTINE, MD    ADMISSION DIAGNOSIS:  Closed fracture of left hip, initial encounter (HCC) [S72.002A]  DISCHARGE DIAGNOSIS:  Active Problems:   Closed left hip fracture (HCC)   SECONDARY DIAGNOSIS:   Past Medical History:  Diagnosis Date  . Dementia   . Parkinson disease [facility](HCC)     HOSPITAL COURSE:  Allison Pollard  is a 82 y.o. female admitted 12/21/2015 with chief complaint Hip Pain . Please see H&P performed by Kennedy BuckerMichael Menz, MD for further information. Patient admitted with the above symptoms, underwent left hemi hip arthroplasty for femoral neck fracture 12/22/15 without complications.  DISCHARGE CONDITIONS:   stable  CONSULTS OBTAINED:  Treatment Team:  Kennedy BuckerMichael Menz, MD  DRUG ALLERGIES:   Allergies  Allergen Reactions  . Buspirone Other (See Comments)    UTI and Shakiness  . Citalopram Other (See Comments)    Severe Confusion  . Clonazepam Other (See Comments)    Panic attacks and paranoid  . Hydroxyzine Hives  . Sertraline Other (See Comments)    parkinson's type shaking  . Codeine Rash  . Hydrocodone Rash  . Neomycin Rash  . Oxycodone Rash  . Penicillins Rash    Has patient had a PCN reaction causing immediate rash, facial/tongue/throat swelling, SOB or lightheadedness with hypotension: Yes Has patient had a PCN reaction causing severe rash involving mucus membranes or skin necrosis: No Has patient had a PCN reaction that required hospitalization No Has patient had a PCN reaction occurring within the last 10 years: No If all of the above answers are "NO", then may proceed with Cephalosporin use.  . Sulfa Antibiotics Rash    DISCHARGE MEDICATIONS:   Current Discharge Medication  List    START taking these medications   Details  acetaminophen (TYLENOL) 325 MG tablet Take 2 tablets (650 mg total) by mouth every 6 (six) hours as needed for mild pain (or Fever >/= 101). Qty: 30 tablet, Refills: 0    ciprofloxacin (CIPRO) 500 MG tablet Take 1 tablet (500 mg total) by mouth 2 (two) times daily. Qty: 6 tablet, Refills: 0    docusate sodium (COLACE) 100 MG capsule Take 1 capsule (100 mg total) by mouth 2 (two) times daily. Qty: 10 capsule, Refills: 0    enoxaparin (LOVENOX) 30 MG/0.3ML injection Inject 0.3 mLs (30 mg total) into the skin daily. Qty: 4.2 mL, Refills: 0    methocarbamol (ROBAXIN) 500 MG tablet Take 1 tablet (500 mg total) by mouth every 6 (six) hours as needed for muscle spasms. Qty: 30 tablet, Refills: 0    traMADol (ULTRAM) 50 MG tablet Take 1 tablet (50 mg total) by mouth every 6 (six) hours as needed for moderate pain. Qty: 30 tablet, Refills: 0      CONTINUE these medications which have NOT CHANGED   Details  carbidopa-levodopa (SINEMET IR) 25-100 MG tablet Take 0.5 tablets by mouth 3 (three) times daily.    Cholecalciferol (VITAMIN D-3) 1000 units CAPS Take 2,000 Units by mouth every morning.    donepezil (ARICEPT) 10 MG tablet Take 10 mg by mouth every evening.     fluticasone (FLONASE) 50 MCG/ACT nasal spray Place 2 sprays into both nostrils daily as needed for rhinitis.  methimazole (TAPAZOLE) 5 MG tablet Take 2.5 mg by mouth every morning.    mirtazapine (REMERON) 30 MG tablet Take 30 mg by mouth every evening.    omeprazole (PRILOSEC) 20 MG capsule Take 1 capsule by mouth daily.    QUEtiapine (SEROQUEL) 25 MG tablet Take 25 mg by mouth 2 (two) times daily.     traZODone (DESYREL) 50 MG tablet Take 25 mg by mouth 3 (three) times daily.      STOP taking these medications     mupirocin ointment (BACTROBAN) 2 %          DISCHARGE INSTRUCTIONS:   Left WBT DIET:  Regular diet  DISCHARGE CONDITION:  Stable  ACTIVITY:    Activity as tolerated  OXYGEN:  Home Oxygen: No.   Oxygen Delivery: room air  DISCHARGE LOCATION:  nursing home   If you experience worsening of your admission symptoms, develop shortness of breath, life threatening emergency, suicidal or homicidal thoughts you must seek medical attention immediately by calling 911 or calling your MD immediately  if symptoms less severe.  You Must read complete instructions/literature along with all the possible adverse reactions/side effects for all the Medicines you take and that have been prescribed to you. Take any new Medicines after you have completely understood and accpet all the possible adverse reactions/side effects.   Please note  You were cared for by a hospitalist during your hospital stay. If you have any questions about your discharge medications or the care you received while you were in the hospital after you are discharged, you can call the unit and asked to speak with the hospitalist on call if the hospitalist that took care of you is not available. Once you are discharged, your primary care physician will handle any further medical issues. Please note that NO REFILLS for any discharge medications will be authorized once you are discharged, as it is imperative that you return to your primary care physician (or establish a relationship with a primary care physician if you do not have one) for your aftercare needs so that they can reassess your need for medications and monitor your lab values.    On the day of Discharge:   VITAL SIGNS:  Blood pressure 130/73, pulse 87, temperature 97.6 F (36.4 C), temperature source Oral, resp. rate 16, height 5\' 3"  (1.6 m), weight 44.5 kg (98 lb), SpO2 100 %.  I/O:   Intake/Output Summary (Last 24 hours) at 12/23/15 0942 Last data filed at 12/23/15 16100633  Gross per 24 hour  Intake          4463.34 ml  Output             1605 ml  Net          2858.34 ml    PHYSICAL EXAMINATION:  GENERAL:  82  y.o.-year-old patient lying in the bed with no acute distress.  EYES: Pupils equal, round, reactive to light and accommodation. No scleral icterus. Extraocular muscles intact.  HEENT: Head atraumatic, normocephalic. Oropharynx and nasopharynx clear.  NECK:  Supple, no jugular venous distention. No thyroid enlargement, no tenderness.  LUNGS: Normal breath sounds bilaterally, no wheezing, rales,rhonchi or crepitation. No use of accessory muscles of respiration.  CARDIOVASCULAR: S1, S2 normal. No murmurs, rubs, or gallops.  ABDOMEN: Soft, non-tender, non-distended. Bowel sounds present. No organomegaly or mass.  EXTREMITIES: No pedal edema, cyanosis, or clubbing.  NEUROLOGIC: Cranial nerves II through XII are intact. Muscle strength 5/5 in all extremities. Sensation intact. Gait not  checked.  PSYCHIATRIC: The patient is alert and oriented x 3.  SKIN: No obvious rash, lesion, or ulcer.   DATA REVIEW:   CBC  Recent Labs Lab 12/23/15 0257  WBC 9.3  HGB 10.6*  HCT 30.9*  PLT 124*    Chemistries   Recent Labs Lab 12/23/15 0257  NA 139  K 4.0  CL 106  CO2 28  GLUCOSE 154*  BUN 13  CREATININE 0.75  CALCIUM 7.8*    Cardiac Enzymes No results for input(s): TROPONINI in the last 168 hours.  Microbiology Results  Results for orders placed or performed during the hospital encounter of 12/21/15  Surgical PCR screen     Status: None   Collection Time: 12/21/15  3:48 PM  Result Value Ref Range Status   MRSA, PCR NEGATIVE NEGATIVE Final   Staphylococcus aureus NEGATIVE NEGATIVE Final    Comment:        The Xpert SA Assay (FDA approved for NASAL specimens in patients over 64 years of age), is one component of a comprehensive surveillance program.  Test performance has been validated by [facility] for patients greater than or equal to 71 year old. It is not intended to diagnose infection nor to guide or monitor treatment.     RADIOLOGY:  Dg Chest 1 View  Result Date:  12/21/2015 CLINICAL DATA:  Pain following fall EXAM: CHEST 1 VIEW COMPARISON:  August 09, 2011 FINDINGS: There is no edema or consolidation. The heart size and pulmonary vascularity are normal. No adenopathy. There is atherosclerotic calcification in the aorta. There is thoracolumbar levoscoliosis. IMPRESSION: No edema or consolidation.  There is aortic atherosclerosis. Electronically Signed   By: Bretta Bang III M.D.   On: 12/21/2015 11:52   Dg Hip Operative Unilat W Or W/o Pelvis Left  Result Date: 12/22/2015 CLINICAL DATA:  Left hip fracture EXAM: OPERATIVE LEFT HIP WITH PELVIS COMPARISON:  None. FLUOROSCOPY TIME:  Radiation Exposure Index (as provided by the fluoroscopic device): Not available If the device does not provide the exposure index: Fluoroscopy Time:  6 seconds Number of Acquired Images:  2 FINDINGS: Initial film again demonstrates a subcapital femoral neck fracture. Subsequent image shows a left hip replacement in satisfactory position. No acute abnormality is noted. IMPRESSION: Status post left hip replacement Electronically Signed   By: Alcide Clever M.D.   On: 12/22/2015 13:37   Dg Hip Unilat W Or W/o Pelvis 2-3 Views Left  Result Date: 12/22/2015 CLINICAL DATA:  Post LEFT total hip replacement, postop pain EXAM: DG HIP (WITH OR WITHOUT PELVIS) 2-3V LEFT COMPARISON:  12/21/2015 FINDINGS: LEFT hip prosthesis identified. Bones demineralized. No acute fracture, dislocation or bone destruction. Soft tissue swelling, minimal soft tissue gas and skin clips are seen overlying surgical region. IMPRESSION: LEFT hip prosthesis without acute complication. Electronically Signed   By: Ulyses Southward M.D.   On: 12/22/2015 14:22   Dg Hip Unilat With Pelvis 2-3 Views Left  Result Date: 12/21/2015 CLINICAL DATA:  Pain following fall EXAM: DG HIP (WITH OR WITHOUT PELVIS) 2-3V LEFT COMPARISON:  None. FINDINGS: Frontal pelvis as well as frontal and lateral left hip images were obtained it. There is a  fracture of the subcapital left femoral neck. There is superior displacement of the femoral shaft with respect to the femoral head. No other fracture evident. No dislocation. Bones are osteoporotic. There is mild symmetric narrowing of both hip joints. IMPRESSION: Subcapital femoral neck fracture on the left with mild fracture fragment displacement. Bones osteoporotic.  Mild symmetric narrowing of both hip joints. No dislocation evident. Electronically Signed   By: Bretta Bang III M.D.   On: 12/21/2015 11:53     Management plans discussed with the patient, family and they are in agreement.  CODE STATUS:     Code Status Orders        Start     Ordered   12/21/15 1523  Full code  Continuous     12/21/15 1522    Code Status History    Date Active Date Inactive Code Status Order ID Comments User Context   12/21/2015 12:50 PM 12/21/2015  3:22 PM Full Code [id number]  Kennedy Bucker, MD ED    Advance Directive Documentation   Flowsheet Row Most Recent Value  Type of Advance Directive  Healthcare Power of Attorney, Living will  Pre-existing out of facility DNR order (yellow form or pink MOST form)  No data  "MOST" Form in Place?  No data      TOTAL TIME TAKING CARE OF THIS PATIENT: 33 minutes.    Azzam Mehra,  Mardi Mainland.D on 12/23/2015 at 9:42 AM  Between 7am to 6pm - Pager - [phone]  After 6pm go to [email or web address] - Social research officer, government  [employer] [place]  Office  [phone]  CC: Primary care physician; HART, Charlton Amor, MD

## 2015-12-23 NOTE — Evaluation (Signed)
Physical Therapy Evaluation Patient Details Name: Allison RileBarbara H Pollard MRN: [id number] DOB: 10/17/1933 Today's Date: 12/23/2015   History of Present Illness  Pt is a 82 y/o F who presented to the hospital with L hip pain and difficulty walking.  Pt had a fall at home 4 days prior but was able to get herself up and ambulate.  The day of admission she got up from the toilet, heard a pop on L hip and went to the ground.  Her daughter brought her to the ER.  X-rays revealed L hip fx and pt is now s/p L hip anterior approach hemi arthroplasty.  Pt's PMH includes dementia, Parkinson's Disease,     Clinical Impression  Patient is s/p above surgery resulting in functional limitations due to the deficits listed below (see PT Problem List). Ms. Allison Pollard presents pleasantly confused and perseverating on finding two family members saying, "I was looking for the twins in the mall, where are the twins?, My uncle has twins".   Pt unable to provide information regarding PLOF or home layout and no family present during evaluation.  Per RN pt has had multiple recent falls.  She current requires mod assist for bed mobility, sit>stand from bed, and short distance ambulation (5 ft) using a RW.  She tolerated all therapeutic exercises.  Given pt's current mobility status, recommending SNF for continued rehab at d/c.  Patient will benefit from skilled PT to increase their independence and safety with mobility to allow discharge to the venue listed below.      Follow Up Recommendations SNF    Equipment Recommendations  Other (comment) (TBD at next venue of care)    Recommendations for Other Services OT consult     Precautions / Restrictions Precautions Precautions: Fall Precaution Comments: Direct anterior, no hip precautions.  Spoke with Dr. Clint Pollard over the phone prior to this evaluation who confirmed pt may get OOB and ambulate and to disregard current activity orders of Bed rest with bathroom  priviledges. Restrictions Weight Bearing Restrictions: Yes LLE Weight Bearing: Weight bearing as tolerated      Mobility  Bed Mobility Overal bed mobility: Needs Assistance Bed Mobility: Supine to Sit     Supine to sit: Mod assist;HOB elevated     General bed mobility comments: Cues for sequencing and use of bed pad to scoot EOB.  Pt uses bed rail with HOB elevated.  Transfers Overall transfer level: Needs assistance Equipment used: Rolling walker (2 wheeled);None Transfers: Sit to/from [employer]Stand;Stand Pivot Transfers Sit to Stand: Min assist;+2 physical assistance;Mod assist Stand pivot transfers: Mod assist;+2 physical assistance;+2 safety/equipment       General transfer comment: Mod +2 assist to boost to standing from bed and to steady while pivoting to [facility] with cues for upright posture, no AD used.  Min +2 assist to boost to [facility] and to steady.  Pt has difficulty taking steps back toward the chair and requires mod assist to pivot as pt shuffles her feet.  Ambulation/Gait Ambulation/Gait assistance: Mod assist;+2 physical assistance Ambulation Distance (Feet): 5 Feet Assistive device: Rolling walker (2 wheeled) Gait Pattern/deviations: Shuffle;Decreased weight shift to left;Decreased stance time - left;Decreased step length - right;Antalgic;Trunk flexed;Staggering right;Staggering left Gait velocity: decreased Gait velocity interpretation: <1.8 ft/sec, indicative of risk for recurrent falls General Gait Details: Pt unsteady and requires assist to stabilize while [facility]ulating from [facility] to chair.  Used RW with cues to stand upright as pt demonstrates flexed posture as she reports pain and fatigue.  Stairs  Wheelchair Mobility    Modified Rankin (Stroke Patients Only)       Balance Overall balance assessment: Needs assistance;History of Falls Sitting-balance support: Single extremity supported;Feet supported Sitting balance-Leahy Scale:  Poor Sitting balance - Comments: Pt must have at least 1 UE supported while sitting EOB due to posterior and L lateral lean (due to contour of bed)   Standing balance support: Bilateral upper extremity supported;During functional activity Standing balance-Leahy Scale: Poor Standing balance comment: Relies on RW and physical assist to steady                             Pertinent Vitals/Pain Pain Assessment: Faces Faces Pain Scale: Hurts even more Pain Location: L hip with exercises and ambulation Pain Descriptors / Indicators: Grimacing;Moaning Pain Intervention(s): Limited activity within patient's tolerance;Monitored during session;Repositioned    Home Living                   Additional Comments: No family present to provide information on PLOF or home layout    Prior Function           Comments: No family present to provide information on PLOF or home layout.  Per RN pt has had multiple recent falls.     Hand Dominance        Extremity/Trunk Assessment   Upper Extremity Assessment: Defer to OT evaluation           Lower Extremity Assessment: LLE deficits/detail   LLE Deficits / Details: limited ROM and strength as expected s/p surgery listed above.  Pt initially muscle guarding with therapeutic exercises but gradually relaxes with more repetitions.  Cervical / Trunk Assessment: Kyphotic  Communication   Communication: No difficulties  Cognition Arousal/Alertness: Awake/alert Behavior During Therapy: Anxious;WFL for tasks assessed/performed Overall Cognitive Status: History of cognitive impairments - at baseline                      General Comments General comments (skin integrity, edema, etc.): [place] off upon PT arrival and SpO2 96% on RA.    Exercises Total Joint Exercises Heel Slides: AAROM;Left;10 reps;Supine Hip ABduction/ADduction: AAROM;Left;10 reps;Supine Straight Leg Raises: AAROM;10 reps;Supine;Both;AROM Long Arc Quad:  AROM;Both;10 reps;Seated   Assessment/Plan    PT Assessment Patient needs continued PT services  PT Problem List Decreased strength;Decreased range of motion;Decreased activity tolerance;Decreased balance;Decreased mobility;Decreased cognition;Decreased knowledge of use of DME;Decreased safety awareness;Pain          PT Treatment Interventions DME instruction;Gait training;Functional mobility training;Therapeutic activities;Therapeutic exercise;Balance training;Neuromuscular re-education;Cognitive remediation;Patient/family education;Modalities    PT Goals (Current goals can be found in the Care Plan section)  Acute Rehab PT Goals Patient Stated Goal: decreased pain PT Goal Formulation: With patient Time For Goal Achievement: 01/06/16 Potential to Achieve Goals: Good    Frequency BID   Barriers to discharge   Unsure of amount of assit available     Co-evaluation               End of Session Equipment Utilized During Treatment: Gait belt Activity Tolerance: Patient limited by pain;Patient limited by fatigue Patient left: in chair;with call bell/phone within reach;with chair alarm set Nurse Communication: Mobility status;Weight bearing status         Time: 1610-9604 PT Time Calculation (min) (ACUTE ONLY): 38 min   Charges:   PT Evaluation $PT Eval Moderate Complexity: 1 Procedure PT Treatments $Therapeutic Exercise: 8-22 mins $Therapeutic Activity: 8-22 mins  PT G Codes:        Encarnacion ChuAshley Cathline Dowen PT, DPT 12/23/2015, 10:36 AM

## 2015-12-23 NOTE — Progress Notes (Signed)
PT is recommending SNF. [facility] (12/24/15) pending medical clearance. Clinical Child psychotherapistocial Worker (CSW) sent [facility]. Patient is aware of above. Patient's 2 granddaughters were at bedside and are aware of above. CSW will continue to follow and assist as needed.   Baker Hughes IncorporatedBailey Terence Googe, LCSW [phone]

## 2015-12-23 NOTE — Progress Notes (Signed)
   Subjective: 1 Day Post-Op Procedure(s) (LRB): ANTERIOR APPROACH HEMI HIP ARTHROPLASTY (Left) Patient reports pain as mild.  Pleasantly confused Patient is well, and has had no acute complaints or problems Denies any CP, SOB, ABD pain. We will start therapy today.  Plan is to go Skilled nursing facility after hospital stay.  Objective: Vital signs in last 24 hours: Temp:  [97.2 F (36.2 C)-98.8 F (37.1 C)] 97.6 F (36.4 C) (11/03 0805) Pulse Rate:  [80-97] 87 (11/03 0805) Resp:  [12-18] 16 (11/03 0401) BP: (119-165)/(73-93) 130/73 (11/03 0805) SpO2:  [98 %-100 %] 100 % (11/03 0805)  Intake/Output from previous day: 11/02 0701 - 11/03 0700 In: 4463.3 [P.O.:240; I.V.:3823.3; IV Piggyback:400] Out: 1605 [Urine:1305; Blood:300] Intake/Output this shift: No intake/output data recorded.   Recent Labs  12/21/15 1215 12/22/15 0329 12/23/15 0257  HGB 11.2* 10.4* 10.6*    Recent Labs  12/22/15 0329 12/23/15 0257  WBC 5.4 9.3  RBC 3.35* 3.42*  HCT 29.9* 30.9*  PLT 107* 124*    Recent Labs  12/22/15 0329 12/23/15 0257  NA 140 139  K 3.5 4.0  CL 106 106  CO2 29 28  BUN 13 13  CREATININE 0.80 0.75  GLUCOSE 108* 154*  CALCIUM 8.4* 7.8*    Recent Labs  12/21/15 1215  INR 1.17    EXAM General - Patient is Alert, Appropriate and Confused Extremity - Neurovascular intact Sensation intact distally Intact pulses distally Dorsiflexion/Plantar flexion intact No cellulitis present Compartment soft Dressing - dressing C/D/I and no drainage Motor Function - intact, moving foot and toes well on exam.   Past Medical History:  Diagnosis Date  . Dementia   . Parkinson disease (HCC)     Assessment/Plan:   1 Day Post-Op Procedure(s) (LRB): ANTERIOR APPROACH HEMI HIP ARTHROPLASTY (Left) Active Problems:   Closed left hip fracture (HCC)  Estimated body mass index is 17.36 kg/m as calculated from the following:   Height as of this encounter: 5\' 3"  (1.6  m).   Weight as of this encounter: 44.5 kg (98 lb). Advance diet Up with therapy  Needs BM Hgb stable, recheck labs in the am CM to assist with discharge    DVT Prophylaxis - Lovenox, Foot Pumps and TED hose Weight-Bearing as tolerated to left leg   T. Cranston Neighborhris Gaines, PA-C [facility] 12/23/2015, 8:19 AM

## 2015-12-23 NOTE — Progress Notes (Signed)
Patient foley removed this am. No void in 8 hours. Bladder scanned patient, less than 30 mls urine. Connected patient back to IVF @100 . Notified attending MD. Continue with fluids and reassess.

## 2015-12-23 NOTE — Progress Notes (Signed)
Physical Therapy Treatment Patient Details Name: Gilford RileBarbara H Mallon MRN: [id number] DOB: [birth year] Today's Date: 12/23/2015    History of Present Illness Pt is a 82 y/o F who presented to the hospital with L hip pain and difficulty walking.  Pt had a fall at home 4 days prior but was able to get herself up and ambulate.  The day of admission she got up from the toilet, heard a pop on L hip and went to the ground.  Her daughter brought her to the ER.  X-rays revealed L hip fx and pt is now s/p L hip anterior approach hemi arthroplasty.  Pt's PMH includes dementia, Parkinson's Disease,     PT Comments    [name] made good progress today, ambulating 20 ft using RW and with mod +2 assist.  She initially shuffles her feet but responds well to cues to pick up her feet and take big steps, demonstrating a step to gait pattern.  She requires min +2 assist to stand from the bed with cues for upright posture.  She tolerated all therapeutic exercises well this afternoon.  Recommendation for SNF at d/c remains appropriate.  Pt will benefit from continued skilled PT services to increase functional independence and safety.   Follow Up Recommendations  SNF     Equipment Recommendations  Other (comment) (TBD at next venue of care)    Recommendations for Other Services       Precautions / Restrictions Precautions Precautions: Fall Precaution Comments: Direct anterior, no hip precautions.  Spoke with Dr. Clint GuyHower over the phone prior to this evaluation who confirmed pt may get OOB and ambulate and to disregard current activity orders of Bed rest with bathroom priviledges. Restrictions Weight Bearing Restrictions: Yes LLE Weight Bearing: Weight bearing as tolerated    Mobility  Bed Mobility Overal bed mobility: Needs Assistance Bed Mobility: Supine to Sit;Sit to Supine     Supine to sit: Mod assist;HOB elevated Sit to supine: Max assist;+2 for physical assistance   General bed mobility comments:  Cues for sequencing and use of bed pad to scoot EOB.  Pt uses bed rail with HOB elevated.  Max +2 to assist back to supine with assist provided to trunk and to BLEs.  [facility]  Transfers Overall transfer level: Needs assistance Equipment used: Rolling walker (2 wheeled) Transfers: Sit to/from Stand Sit to Stand: Min assist;+2 physical assistance         General transfer comment: Min +2 to assist to boost to standing from bed with cues for hand placement and to stand upright as pt demonstrates flexed posture and brace LEs against side of bed.  Cues for hand placement when sitting and assist for controlled descent.  Ambulation/Gait Ambulation/Gait assistance: Mod assist;+2 safety/equipment Ambulation Distance (Feet): 20 Feet Assistive device: Rolling walker (2 wheeled) Gait Pattern/deviations: Step-to pattern;Decreased stride length;Decreased weight shift to left;Decreased stance time - left;Decreased step length - right;Antalgic;Trunk flexed Gait velocity: decreased Gait velocity interpretation: <1.8 ft/sec, indicative of risk for recurrent falls General Gait Details: Assist to remain steady and to manage RW.  Cues for upright posture which pt does not maintain.  Initially shuffling her feet, able to increase step length Bil with cues to lift her feet up and take big steps.     Stairs            Wheelchair Mobility    Modified Rankin (Stroke Patients Only)       Balance Overall balance assessment:  Needs assistance;History of Falls Sitting-balance support: Feet supported;No upper extremity supported Sitting balance-Leahy Scale: Fair Sitting balance - Comments: Pt able to sit EOB without UE support with supervision   Standing balance support: Bilateral upper extremity supported;During functional activity Standing balance-Leahy Scale: Poor Standing balance comment: Relies on RW and physical assist for support                    Cognition  Arousal/Alertness: Awake/alert Behavior During Therapy: Anxious;WFL for tasks assessed/performed Overall Cognitive Status: History of cognitive impairments - at baseline                      Exercises Total Joint Exercises Ankle Circles/Pumps: AROM;Both;10 reps;Supine;AAROM Quad Sets: Strengthening;Both;10 reps;Supine Heel Slides: AAROM;Left;10 reps;Supine Hip ABduction/ADduction: AAROM;Left;10 reps;Supine Straight Leg Raises: AAROM;10 reps;Supine;Both;AROM    General Comments        Pertinent Vitals/Pain Pain Assessment: Faces Pain Score: 5  Faces Pain Scale: Hurts even more Pain Location: L hip with exercises and ambulation and abdomen (per RN pt has not voided yet today, bladder scan after PT) Pain Descriptors / Indicators: Grimacing;Guarding;Moaning Pain Intervention(s): Limited activity within patient's tolerance;Monitored during session;Repositioned;Relaxation    Home Living Family/patient expects to be discharged to:: Private residence Living Arrangements: Alone Available Help at Discharge: Family Type of Home: House Home Access: Stairs to enter   Home Layout: Two level Home Equipment: Shower seat      Prior Function Level of Independence: Independent          PT Goals (current goals can now be found in the care plan section) Acute Rehab PT Goals Patient Stated Goal: decreased pain PT Goal Formulation: With patient Time For Goal Achievement: 01/06/16 Potential to Achieve Goals: Good Progress towards PT goals: Progressing toward goals    Frequency    BID      PT Plan Current plan remains appropriate    Co-evaluation             End of Session Equipment Utilized During Treatment: Gait belt Activity Tolerance: Patient limited by pain;Patient limited by fatigue Patient left: with call bell/phone within reach;in bed;with bed alarm set;with family/visitor present;with SCD's reapplied     Time: [id number] PT Time Calculation (min) (ACUTE  ONLY): 28 min  Charges:  $Gait Training: 8-22 mins $Therapeutic Exercise: 8-22 mins                    G Codes:      Encarnacion ChuAshley Ligia Duguay PT, DPT 12/23/2015, 3:42 PM

## 2015-12-24 NOTE — Progress Notes (Signed)
   Subjective: 2 Days Post-Op Procedure(s) (LRB): ANTERIOR APPROACH HEMI HIP ARTHROPLASTY (Left) Patient reports pain as mild.   Patient is well, and has had no acute complaints or problems Continue with physical therapy today.  Plan is to go Rehab after hospital stay. no nausea and no vomiting Patient denies any chest pains or shortness of breath. Objective: Vital signs in last 24 hours: Temp:  [97.5 F (36.4 C)-98.8 F (37.1 C)] 98.8 F (37.1 C) (11/04 0723) Pulse Rate:  [81-94] 85 (11/04 0723) Resp:  [16-18] 18 (11/03 1957) BP: (101-136)/(64-70) 132/64 (11/04 0723) SpO2:  [99 %-100 %] 99 % (11/04 0723) well approximated incision Heels are non tender and elevated off the bed using rolled towels Intake/Output from previous day: 11/03 0701 - 11/04 0700 In: 4325 [P.O.:1320; I.V.:2605; IV Piggyback:400] Out: 300 [Urine:300] Intake/Output this shift: No intake/output data recorded.   Recent Labs  12/21/15 1215 12/22/15 0329 12/23/15 0257  HGB 11.2* 10.4* 10.6*    Recent Labs  12/22/15 0329 12/23/15 0257  WBC 5.4 9.3  RBC 3.35* 3.42*  HCT 29.9* 30.9*  PLT 107* 124*    Recent Labs  12/22/15 0329 12/23/15 0257  NA 140 139  K 3.5 4.0  CL 106 106  CO2 29 28  BUN 13 13  CREATININE 0.80 0.75  GLUCOSE 108* 154*  CALCIUM 8.4* 7.8*    Recent Labs  12/21/15 1215  INR 1.17    EXAM General - Patient is Alert and Appropriate Extremity - Neurologically intact Neurovascular intact Sensation intact distally Intact pulses distally Dorsiflexion/Plantar flexion intact No cellulitis present Compartment soft Dressing - moderate drainage Motor Function - intact, moving foot and toes well on exam.    Past Medical History:  Diagnosis Date  . Dementia   . Parkinson disease (HCC)     Assessment/Plan: 2 Days Post-Op Procedure(s) (LRB): ANTERIOR APPROACH HEMI HIP ARTHROPLASTY (Left) Active Problems:   Closed left hip fracture (HCC)  Estimated body mass  index is 17.36 kg/m as calculated from the following:   Height as of this encounter: 5\' 3"  (1.6 m).   Weight as of this encounter: 44.5 kg (98 lb). Up with therapy Discharge to SNF  Labs: None DVT Prophylaxis - Lovenox, Foot Pumps and TED hose Weight-Bearing as tolerated to left leg Please change dressing left hip prior to being discharged   Jon R. [facility] PA [facility] 12/24/2015, 8:05 AM

## 2015-12-24 NOTE — Discharge Summary (Signed)
[facility]   PATIENT NAME: Allison Pollard    MR#:  [id number]  DATE OF BIRTH:  January 27, 1934  DATE OF ADMISSION:  12/21/2015 ADMITTING PHYSICIAN: Kennedy Bucker, MD  DATE OF DISCHARGE: 12/24/15  PRIMARY CARE PHYSICIAN: HART, Charlton Amor, MD    ADMISSION DIAGNOSIS:  Closed fracture of left hip, initial encounter (HCC) [S72.002A]  DISCHARGE DIAGNOSIS:  Active Problems:   Closed left hip fracture (HCC)   SECONDARY DIAGNOSIS:   Past Medical History:  Diagnosis Date  . Dementia   . Parkinson disease [facility])     HOSPITAL COURSE:  Allison Pollard  is a 82 y.o. female admitted 12/21/2015 with chief complaint Hip Pain . Please see H&P performed by Kennedy Bucker, MD for further information. Patient admitted with the above symptoms, underwent left hemi hip arthroplasty for femoral neck fracture 12/22/15 without complications. UTI noted on admission - treated with cipro  DISCHARGE CONDITIONS:   stable  CONSULTS OBTAINED:  Treatment Team:  Kennedy Bucker, MD  DRUG ALLERGIES:   Allergies  Allergen Reactions  . Buspirone Other (See Comments)    UTI and Shakiness  . Citalopram Other (See Comments)    Severe Confusion  . Clonazepam Other (See Comments)    Panic attacks and paranoid  . Hydroxyzine Hives  . Sertraline Other (See Comments)    parkinson's type shaking  . Codeine Rash  . Hydrocodone Rash  . Neomycin Rash  . Oxycodone Rash  . Penicillins Rash    Has patient had a PCN reaction causing immediate rash, facial/tongue/throat swelling, SOB or lightheadedness with hypotension: Yes Has patient had a PCN reaction causing severe rash involving mucus membranes or skin necrosis: No Has patient had a PCN reaction that required hospitalization No Has patient had a PCN reaction occurring within the last 10 years: No If all of the above answers are "NO", then may proceed with Cephalosporin use.  . Sulfa Antibiotics Rash    DISCHARGE  MEDICATIONS:   Current Discharge Medication List    START taking these medications   Details  acetaminophen (TYLENOL) 325 MG tablet Take 2 tablets (650 mg total) by mouth every 6 (six) hours as needed for mild pain (or Fever >/= 101). Qty: 30 tablet, Refills: 0    ciprofloxacin (CIPRO) 500 MG tablet Take 1 tablet (500 mg total) by mouth 2 (two) times daily. Qty: 6 tablet, Refills: 0    docusate sodium (COLACE) 100 MG capsule Take 1 capsule (100 mg total) by mouth 2 (two) times daily. Qty: 10 capsule, Refills: 0    enoxaparin (LOVENOX) 30 MG/0.3ML injection Inject 0.3 mLs (30 mg total) into the skin daily. Qty: 4.2 mL, Refills: 0    methocarbamol (ROBAXIN) 500 MG tablet Take 1 tablet (500 mg total) by mouth every 6 (six) hours as needed for muscle spasms. Qty: 30 tablet, Refills: 0    traMADol (ULTRAM) 50 MG tablet Take 1 tablet (50 mg total) by mouth every 6 (six) hours as needed for moderate pain. Qty: 30 tablet, Refills: 0      CONTINUE these medications which have NOT CHANGED   Details  carbidopa-levodopa (SINEMET IR) 25-100 MG tablet Take 0.5 tablets by mouth 3 (three) times daily.    Cholecalciferol (VITAMIN D-3) 1000 units CAPS Take 2,000 Units by mouth every morning.    donepezil (ARICEPT) 10 MG tablet Take 10 mg by mouth every evening.     fluticasone (FLONASE) 50 MCG/ACT nasal spray Place 2 sprays into both  nostrils daily as needed for rhinitis.    methimazole (TAPAZOLE) 5 MG tablet Take 2.5 mg by mouth every morning.    mirtazapine (REMERON) 30 MG tablet Take 30 mg by mouth every evening.    omeprazole (PRILOSEC) 20 MG capsule Take 1 capsule by mouth daily.    QUEtiapine (SEROQUEL) 25 MG tablet Take 25 mg by mouth 2 (two) times daily.     traZODone (DESYREL) 50 MG tablet Take 25 mg by mouth 3 (three) times daily.      STOP taking these medications     mupirocin ointment (BACTROBAN) 2 %          DISCHARGE INSTRUCTIONS:   Left WBT DIET:  Regular  diet  DISCHARGE CONDITION:  Stable  ACTIVITY:  Activity as tolerated  OXYGEN:  Home Oxygen: No.   Oxygen Delivery: room air  DISCHARGE LOCATION:  nursing home   If you experience worsening of your admission symptoms, develop shortness of breath, life threatening emergency, suicidal or homicidal thoughts you must seek medical attention immediately by calling 911 or calling your MD immediately  if symptoms less severe.  You Must read complete instructions/literature along with all the possible adverse reactions/side effects for all the Medicines you take and that have been prescribed to you. Take any new Medicines after you have completely understood and accpet all the possible adverse reactions/side effects.   Please note  You were cared for by a hospitalist during your hospital stay. If you have any questions about your discharge medications or the care you received while you were in the hospital after you are discharged, you can call the unit and asked to speak with the hospitalist on call if the hospitalist that took care of you is not available. Once you are discharged, your primary care physician will handle any further medical issues. Please note that NO REFILLS for any discharge medications will be authorized once you are discharged, as it is imperative that you return to your primary care physician (or establish a relationship with a primary care physician if you do not have one) for your aftercare needs so that they can reassess your need for medications and monitor your lab values.    On the day of Discharge:   VITAL SIGNS:  Blood pressure 132/64, pulse 85, temperature 98.8 F (37.1 C), temperature source Axillary, resp. rate 18, height 5\' 3"  (1.6 m), weight 44.5 kg (98 lb), SpO2 99 %.  I/O:    Intake/Output Summary (Last 24 hours) at 12/24/15 0817 Last data filed at 12/24/15 0600  Gross per 24 hour  Intake             4325 ml  Output              300 ml  Net              4025 ml    PHYSICAL EXAMINATION:  GENERAL:  82 y.o.-year-old patient lying in the bed with no acute distress.  EYES: Pupils equal, round, reactive to light and accommodation. No scleral icterus. Extraocular muscles intact.  HEENT: Head atraumatic, normocephalic. Oropharynx and nasopharynx clear.  NECK:  Supple, no jugular venous distention. No thyroid enlargement, no tenderness.  LUNGS: Normal breath sounds bilaterally, no wheezing, rales,rhonchi or crepitation. No use of accessory muscles of respiration.  CARDIOVASCULAR: S1, S2 normal. No murmurs, rubs, or gallops.  ABDOMEN: Soft, non-tender, non-distended. Bowel sounds present. No organomegaly or mass.  EXTREMITIES: No pedal edema, cyanosis, or clubbing.  NEUROLOGIC: Cranial  nerves II through XII are intact. Muscle strength 5/5 in all extremities. Sensation intact. Gait not checked.  PSYCHIATRIC: The patient is awake, pleasantly confused SKIN: No obvious rash, lesion, or ulcer.   DATA REVIEW:   CBC  Recent Labs Lab 12/23/15 0257  WBC 9.3  HGB 10.6*  HCT 30.9*  PLT 124*    Chemistries   Recent Labs Lab 12/23/15 0257  NA 139  K 4.0  CL 106  CO2 28  GLUCOSE 154*  BUN 13  CREATININE 0.75  CALCIUM 7.8*    Cardiac Enzymes No results for input(s): TROPONINI in the last 168 hours.  Microbiology Results  Results for orders placed or performed during the hospital encounter of 12/21/15  Surgical PCR screen     Status: None   Collection Time: 12/21/15  3:48 PM  Result Value Ref Range Status   MRSA, PCR NEGATIVE NEGATIVE Final   Staphylococcus aureus NEGATIVE NEGATIVE Final    Comment:        The Xpert SA Assay (FDA approved for NASAL specimens in patients over 80 years of age), is one component of a comprehensive surveillance program.  Test performance has been validated by [facility] for patients greater than or equal to 80 year old. It is not intended to diagnose infection nor to guide or monitor  treatment.   Urine culture     Status: Abnormal (Preliminary result)   Collection Time: 12/22/15  5:25 PM  Result Value Ref Range Status   Specimen Description URINE, CATHETERIZED  Final   Special Requests NONE  Final   Culture 80,000 COLONIES/mL GRAM NEGATIVE RODS (A)  Final   Report Status PENDING  Incomplete    RADIOLOGY:  Dg Hip Operative Unilat W Or W/o Pelvis Left  Result Date: 12/22/2015 CLINICAL DATA:  Left hip fracture EXAM: OPERATIVE LEFT HIP WITH PELVIS COMPARISON:  None. FLUOROSCOPY TIME:  Radiation Exposure Index (as provided by the fluoroscopic device): Not available If the device does not provide the exposure index: Fluoroscopy Time:  6 seconds Number of Acquired Images:  2 FINDINGS: Initial film again demonstrates a subcapital femoral neck fracture. Subsequent image shows a left hip replacement in satisfactory position. No acute abnormality is noted. IMPRESSION: Status post left hip replacement Electronically Signed   By: Alcide CleverMark  Lukens M.D.   On: 12/22/2015 13:37   Dg Hip Unilat W Or W/o Pelvis 2-3 Views Left  Result Date: 12/22/2015 CLINICAL DATA:  Post LEFT total hip replacement, postop pain EXAM: DG HIP (WITH OR WITHOUT PELVIS) 2-3V LEFT COMPARISON:  12/21/2015 FINDINGS: LEFT hip prosthesis identified. Bones demineralized. No acute fracture, dislocation or bone destruction. Soft tissue swelling, minimal soft tissue gas and skin clips are seen overlying surgical region. IMPRESSION: LEFT hip prosthesis without acute complication. Electronically Signed   By: Ulyses SouthwardMark  Boles M.D.   On: 12/22/2015 14:22     Management plans discussed with the patient, family and they are in agreement.  CODE STATUS:     Code Status Orders        Start     Ordered   12/21/15 1523  Full code  Continuous     12/21/15 1522    Code Status History    Date Active Date Inactive Code Status Order ID Comments User Context   12/21/2015 12:50 PM 12/21/2015  3:22 PM Full Code [id number]  Kennedy BuckerMichael Menz, MD  ED    Advance Directive Documentation   Flowsheet Row Most Recent Value  Type of Advance Directive  Healthcare  Power of Attorney, Living will  Pre-existing out of facility DNR order (yellow form or pink MOST form)  No data  "MOST" Form in Place?  No data      TOTAL TIME TAKING CARE OF THIS PATIENT: 33 minutes.    Vester Balthazor,  Mardi Mainland.D on 12/24/2015 at 8:17 AM  Between 7am to 6pm - Pager - [phone]  After 6pm go to [email or web address] - Social research officer, government  [employer] [facility]  Office  [phone]  CC: Primary care physician; HART, Charlton Amor, MD

## 2015-12-24 NOTE — Progress Notes (Signed)
Report called to Aggie Cosierheresa at [facility], EMS called for transportation.

## 2015-12-24 NOTE — Clinical Social Work Note (Signed)
Patient to dc to [facility] via non-emergent EMS due to dementia. The patient's daughters and granddaughter were at bedside and aware of dc. The facility is aware. CSW will con't to follow pending any additional dc needs.  Argentina PonderKaren Martha Morrell Fluke, MSW, LCSW-A [phone]

## 2015-12-24 NOTE — Discharge Instructions (Signed)
Patient Name Sex DOB SSN  Allison RileBarbara H Pollard  @GENDER @ [birth year] [id number]    [name]   Author: Tera PartridgeWOLFE,Nilah Belcourt R. Service: Orthopedics Author Type: Physician Assistant    Filed: @T @ Note Time: 8:08 AM Status: Signed   Editor: Tera PartridgeWOLFE,Roddy Bellamy R., PA     Expand All Collapse All   ANTERIOR APPROACH TOTAL HIP REPLACEMENT POSTOPERATIVE DIRECTIONS   Hip Rehabilitation, Guidelines Following Surgery  The results of a hip operation are greatly improved after range of motion and muscle strengthening exercises. Follow all safety measures which are given to protect your hip. If any of these exercises cause increased pain or swelling in your joint, decrease the amount until you are comfortable again. Then slowly increase the exercises. Call your caregiver if you have problems or questions.   HOME CARE INSTRUCTIONS   Remove items at home which could result in a fall. This includes throw rugs or furniture in walking pathways.   ICE to the affected hip every three hours for 30 minutes at a time and then as needed for pain and swelling. Continue to use ice on the hip for pain and swelling from surgery. You may notice swelling that will progress down to the foot and ankle. This is normal after surgery. Elevate the leg when you are not up walking on it.   Continue to use the breathing machine which will help keep your temperature down. It is common for your temperature to cycle up and down following surgery, especially at night when you are not up moving around and exerting yourself. The breathing machine keeps your lungs expanded and your temperature down.  Do not place pillow under knee, focus on keeping the knee straight while resting  DIET You may resume your previous home diet once your are discharged from the hospital.  DRESSING / WOUND CARE / SHOWERING Change the surgical dressing as needed If the wound gets wet inside, change the dressing with sterile gauze. Please  use good hand washing techniques before changing the dressing. Do not use any lotions or creams on the incision until instructed by your surgeon.Keep your dressing dry with showering.  Keep your dressing dry with showering. You can keep it covered and pat dry.  STAPLE REMOVAL: Please remove staples two weeks post op and apply benzoin and 1/2 inch steri strips  ACTIVITY Walk with your walker as instructed. Use walker as long as suggested by your caregivers. Avoid periods of inactivity such as sitting longer than an hour when not asleep. This helps prevent blood clots.  You may resume a sexual relationship in one month or when given the OK by your doctor.  You may return to work once you are cleared by your doctor.  Do not drive a car for 6 weeks or until released by you surgeon.  Do not drive while taking narcotics.  WEIGHT BEARING Weight bearing as tolerated with assist device (walker, cane, etc) as directed, use it as long as suggested by your surgeon or therapist, typically at least 4-6 weeks.  POSTOPERATIVE CONSTIPATION PROTOCOL Constipation - defined medically as fewer than three stools per week and severe constipation as less than one stool per week.  One of the most common issues patients have following surgery is constipation. Even if you have a regular bowel pattern at home, your normal regimen is likely to be disrupted due to multiple reasons following surgery. Combination of anesthesia, postoperative narcotics, change in appetite and fluid intake all can affect your bowels.  In order to avoid complications following surgery, here are some recommendations in order to help you during your recovery period.  Colace (docusate) - Pick up an over-the-counter form of Colace or another stool softener and take twice a day as long as you are requiring postoperative pain medications. Take with a full glass of water daily. If you experience loose stools or diarrhea, hold the colace  until you stool forms back up. If your symptoms do not get better within 1 week or if they get worse, check with your doctor.  Dulcolax (bisacodyl) - Pick up over-the-counter and take as directed by the product packaging as needed to assist with the movement of your bowels. Take with a full glass of water. Use this product as needed if not relieved by Colace only.   MiraLax (polyethylene glycol) - Pick up over-the-counter to have on hand. MiraLax is a solution that will increase the amount of water in your bowels to assist with bowel movements. Take as directed and can mix with a glass of water, juice, soda, coffee, or tea. Take if you go more than two days without a movement. Do not use MiraLax more than once per day. Call your doctor if you are still constipated or irregular after using this medication for 7 days in a row.  If you continue to have problems with postoperative constipation, please contact the office for further assistance and recommendations. If you experience "the worst abdominal pain ever" or develop nausea or vomiting, please contact the office immediatly for further recommendations for treatment.  ITCHING If you experience itching with your medications, try taking only a single pain pill, or even half a pain pill at a time. You can also use Benadryl over the counter for itching or also to help with sleep.   TED HOSE STOCKINGS Wear the elastic stockings on both legs for 6 weeks following surgery during the day but you may remove then at night for sleeping.  MEDICATIONS See your medication summary on the After Visit Summary that the nursing staff will review with you prior to discharge. You may have some home medications which will be placed on hold until you complete the course of blood thinner medication. It is important for you to complete the blood thinner medication as prescribed by your surgeon. Continue your approved medications as instructed at time of  discharge.  PRECAUTIONS If you experience chest pain or shortness of breath - call 911 immediately for transfer to the hospital emergency department.  If you develop a fever greater that 101 F, purulent drainage from wound, increased redness or drainage from wound, foul odor from the wound/dressing, or calf pain - CONTACT YOUR SURGEON.    FOLLOW-UP APPOINTMENTS Make sure you keep all of your appointments after your operation with your surgeon and caregivers. You should call the office at the above phone number and make an appointment for approximately two weeks after the date of your surgery or on the date instructed by your surgeon outlined in the "After Visit Summary".  RANGE OF MOTION AND STRENGTHENING EXERCISES  These exercises are designed to help you keep full movement of your hip joint. Follow your caregiver's or physical therapist's instructions. Perform all exercises about fifteen times, three times per day or as directed. Exercise both hips, even if you have had only one joint replacement. These exercises can be done on a training (exercise) mat, on the floor, on a table or on a bed. Use whatever works the best and is  most comfortable for you. Use music or television while you are exercising so that the exercises are a pleasant break in your day. This will make your life better with the exercises acting as a break in routine you can look forward to.   Lying on your back, slowly slide your foot toward your buttocks, raising your knee up off the floor. Then slowly slide your foot back down until your leg is straight again.   Lying on your back spread your legs as far apart as you can without causing discomfort.   Lying on your side, raise your upper leg and foot straight up from the floor as far as is comfortable. Slowly lower the leg and repeat.   Lying on your back, tighten up the muscle in the front of your thigh (quadriceps muscles).  You can do this by keeping your leg straight and trying to raise your heel off the floor. This helps strengthen the largest muscle supporting your knee.   Lying on your back, tighten up the muscles of your buttocks both with the legs straight and with the knee bent at a comfortable angle while keeping your heel on the floor.  IF YOU ARE TRANSFERRED TO A SKILLED REHAB FACILITY If the patient is transferred to a skilled rehab facility following release from the hospital, a list of the current medications will be sent to the facility for the patient to continue. When discharged from the skilled rehab facility, please have the facility set up the patient's [place] prior to being released. Also, the skilled facility will be responsible for providing the patient with their medications at time of release from the facility to include their pain medication, the muscle relaxants, and their blood thinner medication. If the patient is still at the rehab facility at time of the two week follow up appointment, the skilled rehab facility will also need to assist the patient in arranging follow up appointment in our office and any transportation needs.  MAKE SURE YOU:   Understand these instructions.   Get help right away if you are not doing well or get worse.   Pick up stool softner and laxative for home use following surgery while on pain medications. Do not submerge incision under water. Please use good hand washing techniques while changing dressing each day. May shower starting three days after surgery. Please use a clean towel to pat the incision dry following showers. Continue to use ice for pain and swelling after surgery. Do not use any lotions or creams on the incision until instructed by your surgeon.

## 2015-12-24 NOTE — Progress Notes (Signed)
Physical Therapy Treatment Patient Details Name: Allison RileBarbara H Pollard MRN: [id number] DOB: [birth year] Today's Date: 12/24/2015    History of Present Illness Pt is a 82 y/o F who presented to the hospital with L hip pain and difficulty walking.  Pt had a fall at home 4 days prior but was able to get herself up and ambulate.  The day of admission she got up from the toilet, heard a pop on L hip and went to the ground.  Her daughter brought her to the ER.  X-rays revealed L hip fx and pt is now s/p L hip anterior approach hemi arthroplasty.  Pt's PMH includes dementia, Parkinson's Disease,     PT Comments    Pt in bed, agrees to session.  Participated in exercises as described below.  Transitioned in/out of bed with max a x 2 today.  Sitting edge of bed with supervision once upright.  She was able to ambulate a few steps forward and backwards in room before needing to sit due to increased post lean despite verbal and tactile cues to correct.  She does have short shuffling gait and responds occasionally to verbal cues to take big and long steps.  Sidestepping she does well with good step length and speed.   Anticiapate discharge to SNF today.   Follow Up Recommendations  SNF     Equipment Recommendations       Recommendations for Other Services       Precautions / Restrictions Precautions Precautions: Fall Precaution Comments: Direct anterior, no hip precautions.  Spoke with Dr. Clint GuyHower over the phone prior to this evaluation who confirmed pt may get OOB and ambulate and to disregard current activity orders of Bed rest with bathroom priviledges. Restrictions Weight Bearing Restrictions: Yes LLE Weight Bearing: Weight bearing as tolerated    Mobility  Bed Mobility Overal bed mobility: Needs Assistance Bed Mobility: Supine to Sit;Sit to Supine     Supine to sit: Max assist;+2 for physical assistance Sit to supine: Max assist;+2 for physical assistance   General bed mobility comments: less  asssitance today with supine to sit transition to edge of bed  Transfers Overall transfer level: Needs assistance Equipment used: Rolling walker (2 wheeled) Transfers: Sit to/from Stand Sit to Stand: +2 physical assistance;Mod assist            Ambulation/Gait Ambulation/Gait assistance: Mod assist;+2 physical assistance Ambulation Distance (Feet): 15 Feet (7 forward, 5 back, 2 sideways) Assistive device: Rolling walker (2 wheeled) Gait Pattern/deviations: Step-to pattern;Decreased step length - right;Decreased step length - left Gait velocity: decreased Gait velocity interpretation: <1.8 ft/sec, indicative of risk for recurrent falls General Gait Details: short steps with assist to remain upright, difficulty with backwards abulation   Stairs            Wheelchair Mobility    Modified Rankin (Stroke Patients Only)       Balance Overall balance assessment: Needs assistance Sitting-balance support: Feet supported Sitting balance-Leahy Scale: Fair Sitting balance - Comments: Pt able to sit EOB without UE support with supervision   Standing balance support: Bilateral upper extremity supported Standing balance-Leahy Scale: Poor Standing balance comment: posterior lean                    Cognition Arousal/Alertness: Awake/alert Behavior During Therapy: Anxious;WFL for tasks assessed/performed Overall Cognitive Status: History of cognitive impairments - at baseline  Exercises Total Joint Exercises Ankle Circles/Pumps: AROM;Both;10 reps;Supine;AAROM Quad Sets: Strengthening;Both;10 reps;Supine Short Arc Quad: AROM;Right;AAROM;Left;10 reps;Supine Heel Slides: AAROM;Left;10 reps;Supine Hip ABduction/ADduction: AAROM;Left;10 reps;Supine Straight Leg Raises: AAROM;10 reps;Supine;Both;AROM Long Arc Quad: AROM;Both;10 reps;Seated    General Comments        Pertinent Vitals/Pain Pain Assessment: 0-10 Pain Score: 8  Pain  Location: L hip, neck and shoulder - left Pain Descriptors / Indicators: Grimacing;Moaning;Sore Pain Intervention(s): Limited activity within patient's tolerance    Home Living                      Prior Function            PT Goals (current goals can now be found in the care plan section) Progress towards PT goals: Progressing toward goals    Frequency    BID      PT Plan Current plan remains appropriate    Co-evaluation             End of Session Equipment Utilized During Treatment: Gait belt Activity Tolerance: Patient limited by pain;Patient limited by fatigue Patient left: with call bell/phone within reach;in bed;with bed alarm set;with family/visitor present;with SCD's reapplied     Time: [id number] PT Time Calculation (min) (ACUTE ONLY): 38 min  Charges:  $Gait Training: 8-22 mins $Therapeutic Exercise: 8-22 mins                    G Codes:      Danielle DessSarah Shynice Sigel 12/24/2015, 10:22 AM

## 2015-12-25 LAB — URINE CULTURE: Culture: 80000 — AB

## 2015-12-26 LAB — SURGICAL PATHOLOGY

## 2015-12-30 NOTE — Anesthesia Postprocedure Evaluation (Signed)
Anesthesia Post Note  Patient: Allison RileBarbara H Pollard  Procedure(s) Performed: Procedure(s) (LRB): ANTERIOR APPROACH HEMI HIP ARTHROPLASTY (Left)  Patient location during evaluation: PACU Anesthesia Type: General Level of consciousness: awake and alert Pain management: pain level controlled Vital Signs Assessment: post-procedure vital signs reviewed and stable Respiratory status: spontaneous breathing, nonlabored ventilation, respiratory function stable and patient connected to nasal cannula oxygen Cardiovascular status: blood pressure returned to baseline and stable Postop Assessment: no signs of nausea or vomiting Anesthetic complications: no    Last Vitals:  Vitals:   12/24/15 1711 12/24/15 1925  BP: 114/66 113/70  Pulse: 97 93  Resp: 16 18  Temp: 37.4 C 37 C    Last Pain:  Vitals:   12/24/15 2000  TempSrc:   PainSc: 0-No pain                 Yevette EdwardsJames G Tiara Bartoli

## 2016-01-02 ENCOUNTER — Encounter: Payer: Self-pay | Admitting: Orthopedic Surgery

## 2016-01-17 ENCOUNTER — Emergency Department: Payer: Medicare Other

## 2016-01-17 ENCOUNTER — Encounter: Payer: Self-pay | Admitting: Internal Medicine

## 2016-01-17 ENCOUNTER — Inpatient Hospital Stay
Admission: EM | Admit: 2016-01-17 | Discharge: 2016-01-20 | DRG: 871 | Disposition: A | Discharge status: Hospice | Payer: Medicare Other | Attending: Internal Medicine | Admitting: Internal Medicine

## 2016-01-17 DIAGNOSIS — Z515 Encounter for palliative care: Secondary | ICD-10-CM | POA: Diagnosis not present

## 2016-01-17 DIAGNOSIS — J189 Pneumonia, unspecified organism: Secondary | ICD-10-CM | POA: Diagnosis present

## 2016-01-17 DIAGNOSIS — G20 Parkinson's disease: Secondary | ICD-10-CM | POA: Diagnosis present

## 2016-01-17 DIAGNOSIS — L89150 Pressure ulcer of sacral region, unstageable: Secondary | ICD-10-CM | POA: Diagnosis present

## 2016-01-17 DIAGNOSIS — D509 Iron deficiency anemia, unspecified: Secondary | ICD-10-CM | POA: Diagnosis present

## 2016-01-17 DIAGNOSIS — Z66 Do not resuscitate: Secondary | ICD-10-CM | POA: Diagnosis present

## 2016-01-17 DIAGNOSIS — E0590 Thyrotoxicosis, unspecified without thyrotoxic crisis or storm: Secondary | ICD-10-CM | POA: Diagnosis present

## 2016-01-17 DIAGNOSIS — Z96642 Presence of left artificial hip joint: Secondary | ICD-10-CM | POA: Diagnosis present

## 2016-01-17 DIAGNOSIS — A419 Sepsis, unspecified organism: Secondary | ICD-10-CM | POA: Diagnosis not present

## 2016-01-17 DIAGNOSIS — Z820 Family history of epilepsy and other diseases of the nervous system: Secondary | ICD-10-CM

## 2016-01-17 DIAGNOSIS — Z681 Body mass index (BMI) 19 or less, adult: Secondary | ICD-10-CM | POA: Diagnosis not present

## 2016-01-17 DIAGNOSIS — K219 Gastro-esophageal reflux disease without esophagitis: Secondary | ICD-10-CM | POA: Diagnosis present

## 2016-01-17 DIAGNOSIS — Z87891 Personal history of nicotine dependence: Secondary | ICD-10-CM

## 2016-01-17 DIAGNOSIS — E46 Unspecified protein-calorie malnutrition: Secondary | ICD-10-CM | POA: Diagnosis present

## 2016-01-17 DIAGNOSIS — Z79899 Other long term (current) drug therapy: Secondary | ICD-10-CM | POA: Diagnosis not present

## 2016-01-17 DIAGNOSIS — Y95 Nosocomial condition: Secondary | ICD-10-CM | POA: Diagnosis present

## 2016-01-17 DIAGNOSIS — E876 Hypokalemia: Secondary | ICD-10-CM | POA: Diagnosis present

## 2016-01-17 DIAGNOSIS — G309 Alzheimer's disease, unspecified: Secondary | ICD-10-CM | POA: Diagnosis present

## 2016-01-17 DIAGNOSIS — Z882 Allergy status to sulfonamides status: Secondary | ICD-10-CM

## 2016-01-17 DIAGNOSIS — L89891 Pressure ulcer of other site, stage 1: Secondary | ICD-10-CM | POA: Diagnosis present

## 2016-01-17 DIAGNOSIS — R627 Adult failure to thrive: Secondary | ICD-10-CM | POA: Diagnosis present

## 2016-01-17 DIAGNOSIS — F0280 Dementia in other diseases classified elsewhere without behavioral disturbance: Secondary | ICD-10-CM | POA: Diagnosis present

## 2016-01-17 DIAGNOSIS — Z7189 Other specified counseling: Secondary | ICD-10-CM | POA: Diagnosis not present

## 2016-01-17 DIAGNOSIS — D649 Anemia, unspecified: Secondary | ICD-10-CM | POA: Diagnosis present

## 2016-01-17 DIAGNOSIS — Z885 Allergy status to narcotic agent status: Secondary | ICD-10-CM

## 2016-01-17 DIAGNOSIS — Z79891 Long term (current) use of opiate analgesic: Secondary | ICD-10-CM | POA: Diagnosis not present

## 2016-01-17 DIAGNOSIS — N390 Urinary tract infection, site not specified: Secondary | ICD-10-CM | POA: Diagnosis present

## 2016-01-17 DIAGNOSIS — L8990 Pressure ulcer of unspecified site, unspecified stage: Secondary | ICD-10-CM | POA: Insufficient documentation

## 2016-01-17 DIAGNOSIS — Z888 Allergy status to other drugs, medicaments and biological substances status: Secondary | ICD-10-CM | POA: Diagnosis not present

## 2016-01-17 DIAGNOSIS — Z880 Allergy status to penicillin: Secondary | ICD-10-CM

## 2016-01-17 DIAGNOSIS — M6281 Muscle weakness (generalized): Secondary | ICD-10-CM

## 2016-01-17 DIAGNOSIS — Z883 Allergy status to other anti-infective agents status: Secondary | ICD-10-CM

## 2016-01-17 LAB — COMPREHENSIVE METABOLIC PANEL
ALT: 12 U/L — AB (ref 14–54)
AST: 31 U/L (ref 15–41)
Albumin: 2.5 g/dL — ABNORMAL LOW (ref 3.5–5.0)
Alkaline Phosphatase: 54 U/L (ref 38–126)
Anion gap: 9 (ref 5–15)
BILIRUBIN TOTAL: 1 mg/dL (ref 0.3–1.2)
BUN: 21 mg/dL — AB (ref 6–20)
CHLORIDE: 107 mmol/L (ref 101–111)
CO2: 25 mmol/L (ref 22–32)
CREATININE: 0.83 mg/dL (ref 0.44–1.00)
Calcium: 8.4 mg/dL — ABNORMAL LOW (ref 8.9–10.3)
GFR calc Af Amer: >60 mL/min (ref >60)
GLUCOSE: 177 mg/dL — AB (ref 65–99)
Potassium: 4.9 mmol/L (ref 3.5–5.1)
Sodium: 141 mmol/L (ref 135–145)
TOTAL PROTEIN: 5.5 g/dL — AB (ref 6.5–8.1)

## 2016-01-17 LAB — CBC
HEMATOCRIT: 35.7 % (ref 35.0–47.0)
Hemoglobin: 11.8 g/dL — ABNORMAL LOW (ref 12.0–16.0)
MCH: 30.2 pg (ref 26.0–34.0)
MCHC: 33.1 g/dL (ref 32.0–36.0)
MCV: 91.4 fL (ref 80.0–100.0)
Platelets: 197 10*3/uL (ref 150–440)
RBC: 3.9 MIL/uL (ref 3.80–5.20)
RDW: 15.2 % — ABNORMAL HIGH (ref 11.5–14.5)
WBC: 13.3 10*3/uL — AB (ref 3.6–11.0)

## 2016-01-17 LAB — T4, FREE: FREE T4: 1.1 ng/dL (ref 0.61–1.12)

## 2016-01-17 LAB — URINALYSIS COMPLETE WITH MICROSCOPIC (ARMC ONLY)
BILIRUBIN URINE: NEGATIVE
Glucose, UA: NEGATIVE mg/dL
Hgb urine dipstick: NEGATIVE
Ketones, ur: NEGATIVE mg/dL
Nitrite: POSITIVE — AB
PH: 6 (ref 5.0–8.0)
Protein, ur: NEGATIVE mg/dL
SQUAMOUS EPITHELIAL / LPF: NONE SEEN
Specific Gravity, Urine: 1.017 (ref 1.005–1.030)

## 2016-01-17 LAB — LIPASE, BLOOD: LIPASE: 28 U/L (ref 11–51)

## 2016-01-17 LAB — LACTIC ACID, PLASMA
Lactic Acid, Venous: 2.6 mmol/L (ref 0.5–1.9)
Lactic Acid, Venous: 1.6 mmol/L (ref 0.5–1.9)

## 2016-01-17 LAB — TSH: TSH: 0.376 u[IU]/mL (ref 0.350–4.500)

## 2016-01-17 MED ORDER — METHIMAZOLE 5 MG PO TABS
2.5000 mg | ORAL_TABLET | ORAL | Status: DC
  Administered 2016-01-19: 2.5 mg via ORAL
  Filled 2016-01-17: qty 1

## 2016-01-17 MED ORDER — CARBIDOPA-LEVODOPA 25-100 MG PO TABS
0.5000 | ORAL_TABLET | Freq: Three times a day (TID) | ORAL | Status: DC
  Administered 2016-01-17 – 2016-01-20 (×8): 0.5 via ORAL
  Filled 2016-01-17 (×9): qty 1

## 2016-01-17 MED ORDER — PANTOPRAZOLE SODIUM 40 MG PO TBEC
40.0000 mg | DELAYED_RELEASE_TABLET | Freq: Every day | ORAL | Status: DC
  Administered 2016-01-17: 40 mg via ORAL
  Filled 2016-01-17 (×3): qty 1

## 2016-01-17 MED ORDER — ACETAMINOPHEN 650 MG RE SUPP
650.0000 mg | Freq: Four times a day (QID) | RECTAL | Status: DC | PRN
  Administered 2016-01-18: 650 mg via RECTAL
  Filled 2016-01-17: qty 1

## 2016-01-17 MED ORDER — ENOXAPARIN SODIUM 40 MG/0.4ML ~~LOC~~ SOLN
40.0000 mg | SUBCUTANEOUS | Status: DC
  Administered 2016-01-17: 40 mg via SUBCUTANEOUS
  Filled 2016-01-17: qty 0.4

## 2016-01-17 MED ORDER — ACETAMINOPHEN 325 MG PO TABS
650.0000 mg | ORAL_TABLET | Freq: Four times a day (QID) | ORAL | Status: DC | PRN
  Administered 2016-01-20: 650 mg via ORAL
  Filled 2016-01-17: qty 2

## 2016-01-17 MED ORDER — DEXTROSE 5 % IV SOLN
2.0000 g | Freq: Three times a day (TID) | INTRAVENOUS | Status: DC
  Administered 2016-01-17 – 2016-01-18 (×3): 2 g via INTRAVENOUS
  Filled 2016-01-17 (×4): qty 2

## 2016-01-17 MED ORDER — TRAZODONE HCL 50 MG PO TABS
25.0000 mg | ORAL_TABLET | Freq: Three times a day (TID) | ORAL | Status: DC
  Administered 2016-01-17 – 2016-01-20 (×8): 25 mg via ORAL
  Filled 2016-01-17 (×9): qty 1

## 2016-01-17 MED ORDER — MIRTAZAPINE 15 MG PO TABS
30.0000 mg | ORAL_TABLET | Freq: Every day | ORAL | Status: DC
  Administered 2016-01-18 – 2016-01-19 (×2): 30 mg via ORAL
  Filled 2016-01-17 (×2): qty 2

## 2016-01-17 MED ORDER — DONEPEZIL HCL 5 MG PO TABS
10.0000 mg | ORAL_TABLET | Freq: Every day | ORAL | Status: DC
  Administered 2016-01-18: 10 mg via ORAL
  Filled 2016-01-17 (×2): qty 2

## 2016-01-17 MED ORDER — VANCOMYCIN HCL IN DEXTROSE 1-5 GM/200ML-% IV SOLN
1000.0000 mg | Freq: Once | INTRAVENOUS | Status: AC
  Administered 2016-01-17: 1000 mg via INTRAVENOUS
  Filled 2016-01-17 (×2): qty 200

## 2016-01-17 MED ORDER — VANCOMYCIN HCL IN DEXTROSE 750-5 MG/150ML-% IV SOLN
750.0000 mg | INTRAVENOUS | Status: DC
  Filled 2016-01-17: qty 150

## 2016-01-17 MED ORDER — AZTREONAM 2 G IJ SOLR
2.0000 g | Freq: Once | INTRAMUSCULAR | Status: AC
  Administered 2016-01-17: 2 g via INTRAVENOUS
  Filled 2016-01-17: qty 2

## 2016-01-17 MED ORDER — ONDANSETRON HCL 4 MG PO TABS: 4.0000 mg | ORAL_TABLET | Freq: Four times a day (QID) | ORAL | Status: DC | PRN

## 2016-01-17 MED ORDER — SODIUM CHLORIDE 0.9 % IV BOLUS (SEPSIS)
2000.0000 mL | Freq: Once | INTRAVENOUS | Status: AC
  Administered 2016-01-17: 2000 mL via INTRAVENOUS

## 2016-01-17 MED ORDER — VITAMIN D 1000 UNITS PO TABS: 2000.0000 [IU] | ORAL_TABLET | ORAL | Status: DC

## 2016-01-17 MED ORDER — TRAMADOL HCL 50 MG PO TABS: 50.0000 mg | ORAL_TABLET | Freq: Four times a day (QID) | ORAL | Status: DC | PRN

## 2016-01-17 MED ORDER — DOCUSATE SODIUM 100 MG PO CAPS
100.0000 mg | ORAL_CAPSULE | Freq: Two times a day (BID) | ORAL | Status: DC
  Administered 2016-01-18: 100 mg via ORAL
  Filled 2016-01-17 (×3): qty 1

## 2016-01-17 MED ORDER — QUETIAPINE FUMARATE 25 MG PO TABS
25.0000 mg | ORAL_TABLET | Freq: Two times a day (BID) | ORAL | Status: DC
  Administered 2016-01-18 – 2016-01-20 (×5): 25 mg via ORAL
  Filled 2016-01-17 (×6): qty 1

## 2016-01-17 MED ORDER — ONDANSETRON HCL 4 MG/2ML IJ SOLN: 4.0000 mg | Freq: Four times a day (QID) | INTRAMUSCULAR | Status: DC | PRN

## 2016-01-17 MED ORDER — VITAMIN C 500 MG PO TABS
500.0000 mg | ORAL_TABLET | Freq: Two times a day (BID) | ORAL | Status: DC
  Administered 2016-01-18 – 2016-01-20 (×3): 500 mg via ORAL
  Filled 2016-01-17 (×5): qty 1

## 2016-01-17 MED ORDER — SODIUM CHLORIDE 0.9 % IV SOLN
INTRAVENOUS | Status: DC
  Administered 2016-01-17 – 2016-01-20 (×5): via INTRAVENOUS

## 2016-01-17 MED ORDER — METHOCARBAMOL 500 MG PO TABS: 500.0000 mg | ORAL_TABLET | Freq: Four times a day (QID) | ORAL | Status: DC | PRN

## 2016-01-17 NOTE — H&P (Signed)
[employer] - [place] at [facility]   PATIENT NAME: Allison RossettiBarbara Pollard    MR#:  [id number]  DATE OF BIRTH:  Nov 03, 1933   DATE OF ADMISSION:  01/17/2016  PRIMARY CARE PHYSICIAN: HART, Charlton AmorLAURA CHRISTINE, MD   REQUESTING/REFERRING PHYSICIAN: Scotty CourtStafford  CHIEF COMPLAINT:   Chief Complaint  Patient presents with  . Failure To Thrive    HISTORY OF PRESENT ILLNESS:  Allison RossettiBarbara Pollard  is a 82 y.o. female with a known history of Dementia, recent left hip arthroplasty performed 12/22/2015 who is presenting from the nursing facility or "failure to thrive" the patient is unable to provide any information history taken from daughter present at bedside. States over the last week she has made a significant decline not eating drinking more confused and was brought to Hospital further workup and evaluation.  At baseline the patient is able to ambulate with walker and "has good days and bad days" as far as mental status goes  PAST MEDICAL HISTORY:   Past Medical History:  Diagnosis Date  . Dementia   . Parkinson disease (HCC)     PAST SURGICAL HISTORY:   Past Surgical History:  Procedure Laterality Date  . ANTERIOR APPROACH HEMI HIP ARTHROPLASTY Left 12/22/2015   Procedure: ANTERIOR APPROACH HEMI HIP ARTHROPLASTY;  Surgeon: Kennedy BuckerMichael Menz, MD;  Location: [facility] ORS;  Service: Orthopedics;  Laterality: Left;  . CHOLECYSTECTOMY      SOCIAL HISTORY:   Social History  Substance Use Topics  . Smoking status: Former Games developermoker  . Smokeless tobacco: Never Used  . Alcohol use No    FAMILY HISTORY:   Family History  Problem Relation Age of Onset  . Diabetes Mother   . Alzheimer's disease Sister   . Alzheimer's disease Brother     DRUG ALLERGIES:   Allergies  Allergen Reactions  . Buspirone Other (See Comments)    UTI and Shakiness  . Citalopram Other (See Comments)    Severe Confusion  . Clonazepam Other (See Comments)    Panic attacks and paranoid  . Hydroxyzine Hives  .  Sertraline Other (See Comments)    parkinson's type shaking  . Codeine Rash  . Hydrocodone Rash  . Neomycin Rash  . Oxycodone Rash  . Penicillins Rash    Has patient had a PCN reaction causing immediate rash, facial/tongue/throat swelling, SOB or lightheadedness with hypotension: Yes Has patient had a PCN reaction causing severe rash involving mucus membranes or skin necrosis: No Has patient had a PCN reaction that required hospitalization No Has patient had a PCN reaction occurring within the last 10 years: No If all of the above answers are "NO", then may proceed with Cephalosporin use.  . Sulfa Antibiotics Rash    REVIEW OF SYSTEMS:  Unable to obtain given patient's mental status  MEDICATIONS AT HOME:   Prior to Admission medications   Medication Sig Start Date End Date Taking? Authorizing Provider  acetaminophen (TYLENOL) 325 MG tablet Take 2 tablets (650 mg total) by mouth every 6 (six) hours as needed for mild pain (or Fever >/= 101). 12/23/15  Yes Wyatt Hasteavid K Hower, MD  carbidopa-levodopa (SINEMET IR) 25-100 MG tablet Take 0.5 tablets by mouth 3 (three) times daily. 10/17/15  Yes Historical Provider, MD  Cholecalciferol (VITAMIN D3) 2000 units TABS Take 2,000 Units by mouth every morning.   Yes Historical Provider, MD  docusate sodium (COLACE) 100 MG capsule Take 1 capsule (100 mg total) by mouth 2 (two) times daily. 12/23/15  Yes Wyatt Hasteavid K Hower,  MD  donepezil (ARICEPT) 10 MG tablet Take 10 mg by mouth at bedtime.    Yes Historical Provider, MD  fluticasone (FLONASE) 50 MCG/ACT nasal spray Place 2 sprays into both nostrils daily as needed for rhinitis. 09/21/15  Yes Historical Provider, MD  levofloxacin (LEVAQUIN) 500 MG tablet Take 500 mg by mouth daily. 12/31/15  Yes Historical Provider, MD  methimazole (TAPAZOLE) 5 MG tablet Take 2.5 mg by mouth every morning. 10/17/15  Yes Historical Provider, MD  methocarbamol (ROBAXIN) 500 MG tablet Take 1 tablet (500 mg total) by mouth every 6 (six)  hours as needed for muscle spasms. 12/23/15  Yes Wyatt Hasteavid K Hower, MD  mirtazapine (REMERON) 30 MG tablet Take 30 mg by mouth at bedtime.    Yes Historical Provider, MD  omeprazole (PRILOSEC) 20 MG capsule Take 20 mg by mouth daily.    Yes Historical Provider, MD  QUEtiapine (SEROQUEL) 25 MG tablet Take 25 mg by mouth 2 (two) times daily.  12/08/15  Yes Historical Provider, MD  traMADol (ULTRAM) 50 MG tablet Take 1 tablet (50 mg total) by mouth every 6 (six) hours as needed for moderate pain. 12/23/15  Yes Wyatt Hasteavid K Hower, MD  traZODone (DESYREL) 50 MG tablet Take 25 mg by mouth 3 (three) times daily. 09/08/15  Yes Historical Provider, MD  vitamin C (ASCORBIC ACID) 500 MG tablet Take 500 mg by mouth 2 (two) times daily.   Yes Historical Provider, MD  enoxaparin (LOVENOX) 30 MG/0.3ML injection Inject 0.3 mLs (30 mg total) into the skin daily. Patient not taking: Reported on 01/17/2016 12/24/15 01/07/16  Wyatt Hasteavid K Hower, MD      VITAL SIGNS:  Blood pressure 125/78, pulse (!) 102, temperature 98.1 F (36.7 C), temperature source Axillary, resp. rate (!) 28, height 5\' 1"  (1.549 m), weight 44.5 kg (98 lb), SpO2 100 %.  PHYSICAL EXAMINATION:  VITAL SIGNS: Vitals:   01/17/16 1400 01/17/16 1445  BP: 118/78 125/78  Pulse: (!) 110 (!) 102  Resp: 15 (!) 28  Temp:     GENERAL:82 y.o.female currently in no acute distress. Weak, frail HEAD: Normocephalic, atraumatic.  EYES: Pupils equal, round, reactive to light. Extraocular muscles intact. No scleral icterus.  MOUTH: Dry mucosal membrane. Dentition intact. No abscess noted.  EAR, NOSE, THROAT: Clear without exudates. No external lesions.  NECK: Supple. No thyromegaly. No nodules. No JVD.  PULMONARY: Clear to ascultation, without wheeze rails or rhonci. No use of accessory muscles, Good respiratory effort. good air entry bilaterally CHEST: Nontender to palpation.  CARDIOVASCULAR: S1 and S2. Tachycardic No murmurs, rubs, or gallops. No edema. Pedal pulses 2+  bilaterally.  GASTROINTESTINAL: Soft, nontender, nondistended. No masses. Positive bowel sounds. No hepatosplenomegaly.  MUSCULOSKELETAL: No swelling, clubbing, or edema. Range of motion full in all extremities.  NEUROLOGIC: Cranial nerves II through XII are intact. No gross focal neurological deficits. Sensation intact. Reflexes intact.  SKIN: No ulceration, lesions, rashes, or cyanosis. Skin warm and dry. Turgor intact.  PSYCHIATRIC: Mood, affect flat. The patient is awake, alert and oriented x 2. Insight, judgment poor at this time   LABORATORY PANEL:   CBC  Recent Labs Lab 01/17/16 1319  WBC 13.3*  HGB 11.8*  HCT 35.7  PLT 197   ------------------------------------------------------------------------------------------------------------------  Chemistries   Recent Labs Lab 01/17/16 1347  NA 141  K 4.9  CL 107  CO2 25  GLUCOSE 177*  BUN 21*  CREATININE 0.83  CALCIUM 8.4*  AST 31  ALT 12*  ALKPHOS 54  BILITOT 1.0   ------------------------------------------------------------------------------------------------------------------  Cardiac Enzymes No results for input(s): TROPONINI in the last 168 hours. ------------------------------------------------------------------------------------------------------------------  RADIOLOGY:  Dg Chest Portable 1 View  Result Date: 01/17/2016 CLINICAL DATA:  Dementia Fahrenheit. Hip fracture. Loss of appetite. EXAM: PORTABLE CHEST 1 VIEW COMPARISON:  12/21/2015 FINDINGS: Heart size is normal. Mediastinal shadows are normal. The left lung is clear. There is patchy pneumonia in the right lower lobe. No dense consolidation, collapse or effusion. Bony structures show chronic spinal curvature. IMPRESSION: Patchy pneumonia right lower lobe. Electronically Signed   By: Paulina Fusi M.D.   On: 01/17/2016 14:01    EKG:   Orders placed or performed during the hospital encounter of 01/17/16  . ED EKG  . ED EKG    IMPRESSION AND PLAN:    82 year old Caucasian female history of Parkinson's and dementia presenting with failure to thrive  1.Sepsis, meeting septic criteria by heart rate, leukocytosis present on arrival. Source healthcare associated pneumonia  Broad-spectrum antibiotics including vancomycin/estrogen and given allergies and taper antibiotics when culture data returns.  . Continue IV fluid hydration to keep mean arterial pressure greater than 65. may require pressor therapy if blood pressure worsens. We will repeat lactic acid if the initial is greater than 2.2.   2. Hyperthyroidism Tapazole check thyroid function tests 3. GERD without esophagitis PPI therapy 4. Alzheimer's dementia Aricept  All the records are reviewed and case discussed with ED provider. Management plans discussed with the patient, family and they are in agreement.  CODE STATUS: Full  TOTAL TIME TAKING CARE OF THIS PATIENT: 33 minutes.    Hower,  Mardi Mainland.D on 01/17/2016 at 3:10 PM  Between 7am to 6pm - Pager - [phone]  After 6pm: House Pager: - [phone]  [facility]  Office  [phone]  CC: Primary care physician; HART, Charlton Amor, MD

## 2016-01-17 NOTE — ED Notes (Signed)
Dr. Scotty CourtStafford notified of critical lactic

## 2016-01-17 NOTE — ED Triage Notes (Signed)
Pt arrives via EMS from [facility], pt was at facility for rehab after a broken hip and pt has not been eating or drinking, staff reports failure to thrive and family wanted her evaluated, upon arrival pt moaning, eyes open

## 2016-01-17 NOTE — ED Notes (Signed)
Pt resting in bed, family at bedside.

## 2016-01-17 NOTE — ED Notes (Addendum)
EDP at bedside, daughter at bedside 

## 2016-01-17 NOTE — Progress Notes (Addendum)
Pharmacy Antibiotic Note  Allison RileBarbara H Pollard is a 82 y.o. female admitted on 01/17/2016 with pneumonia/?UTI.  Pharmacy has been consulted for Vancomycin and Aztreonam dosing. Patient had hip surg ~4 weeks ago and has [name] ?HCAP  Plan: Patient received Vancomycin 1 gram IV x 1 in ER and Aztreonam 2gm  x1 in ER. Will begin: Vancomycin 750 IV every 24 hours.  Goal trough 15-20 mcg/mL. Stacked dosing= 23 hrs.  Ke 0.036 t1/2 19.25  Vd 31 Will order Vancomycin trough prior to 4th dose on 12/1.  Aztreonam 2gm IV q8h.     Height: 5\' 1"  (154.9 cm) Weight: 98 lb (44.5 kg) IBW/kg (Calculated) : 47.8  Temp (24hrs), Avg:98.1 F (36.7 C), Min:98.1 F (36.7 C), Max:98.1 F (36.7 C)   Recent Labs Lab 01/17/16 1319 01/17/16 1347  WBC 13.3*  --   CREATININE  --  0.83  LATICACIDVEN  --  2.6*    Estimated Creatinine Clearance: 38 mL/min (by C-G formula based on SCr of 0.83 mg/dL).    Allergies  Allergen Reactions  . Buspirone Other (See Comments)    UTI and Shakiness  . Citalopram Other (See Comments)    Severe Confusion  . Clonazepam Other (See Comments)    Panic attacks and paranoid  . Hydroxyzine Hives  . Sertraline Other (See Comments)    parkinson's type shaking  . Codeine Rash  . Hydrocodone Rash  . Neomycin Rash  . Oxycodone Rash  . Penicillins Rash    Has patient had a PCN reaction causing immediate rash, facial/tongue/throat swelling, SOB or lightheadedness with hypotension: Yes Has patient had a PCN reaction causing severe rash involving mucus membranes or skin necrosis: No Has patient had a PCN reaction that required hospitalization No Has patient had a PCN reaction occurring within the last 10 years: No If all of the above answers are "NO", then may proceed with Cephalosporin use.  . Sulfa Antibiotics Rash    Antimicrobials this admission: Aztreonam 11/28 >>   Vancomycin 11/28 >>    Dose adjustments this admission:    Microbiology  results: 11/28 BCx: Pending 11/28 UCx: Pending    Sputum:      MRSA PCR:   (note last MRSA PCR 12/21/15 was negative )  Thank you for allowing pharmacy to be a part of this patient's care.  Ida Uppal A 01/17/2016 3:43 PM

## 2016-01-17 NOTE — ED Provider Notes (Signed)
[facility] Emergency Department Provider Note  ____________________________________________  Time seen: Approximately 2:41 PM  I have reviewed the triage vital signs and the nursing notes.   HISTORY  Chief Complaint Failure To Thrive Level 5 caveat:  Portions of the history and physical were unable to be obtained due to the patient's acute illness and dementia    HPI Allison Pollard is a 82 y.o. female brought to the ED due to not eating or drinking and generalized weakness. She recently had surgery on the left hip about 4 weeks ago. She is at [facility] for rehabilitation where things had been going well, but over the last 3 or 4 days she has not been eating or drinking at all. She's become progressively weaker with this. Patient also reports shortness of breath without cough.     Past Medical History:  Diagnosis Date  . Dementia   . Parkinson disease [facility](HCC)      Patient Active Problem List   Diagnosis Date Noted  . Sepsis (HCC) 01/17/2016  . Closed left hip fracture (HCC) 12/21/2015     Past Surgical History:  Procedure Laterality Date  . ANTERIOR APPROACH HEMI HIP ARTHROPLASTY Left 12/22/2015   Procedure: ANTERIOR APPROACH HEMI HIP ARTHROPLASTY;  Surgeon: Kennedy BuckerMichael Menz, MD;  Location: [facility] ORS;  Service: Orthopedics;  Laterality: Left;  . CHOLECYSTECTOMY       Prior to Admission medications   Medication Sig Start Date End Date Taking? Authorizing Provider  acetaminophen (TYLENOL) 325 MG tablet Take 2 tablets (650 mg total) by mouth every 6 (six) hours as needed for mild pain (or Fever >/= 101). 12/23/15  Yes Wyatt Hasteavid K Hower, MD  carbidopa-levodopa (SINEMET IR) 25-100 MG tablet Take 0.5 tablets by mouth 3 (three) times daily. 10/17/15  Yes Historical Provider, MD  Cholecalciferol (VITAMIN D3) 2000 units TABS Take 2,000 Units by mouth every morning.   Yes Historical Provider, MD  docusate sodium (COLACE) 100 MG capsule Take 1 capsule (100 mg  total) by mouth 2 (two) times daily. 12/23/15  Yes Wyatt Hasteavid K Hower, MD  donepezil (ARICEPT) 10 MG tablet Take 10 mg by mouth at bedtime.    Yes Historical Provider, MD  fluticasone (FLONASE) 50 MCG/ACT nasal spray Place 2 sprays into both nostrils daily as needed for rhinitis. 09/21/15  Yes Historical Provider, MD  levofloxacin (LEVAQUIN) 500 MG tablet Take 500 mg by mouth daily. 12/31/15  Yes Historical Provider, MD  methimazole (TAPAZOLE) 5 MG tablet Take 2.5 mg by mouth every morning. 10/17/15  Yes Historical Provider, MD  methocarbamol (ROBAXIN) 500 MG tablet Take 1 tablet (500 mg total) by mouth every 6 (six) hours as needed for muscle spasms. 12/23/15  Yes Wyatt Hasteavid K Hower, MD  mirtazapine (REMERON) 30 MG tablet Take 30 mg by mouth at bedtime.    Yes Historical Provider, MD  omeprazole (PRILOSEC) 20 MG capsule Take 20 mg by mouth daily.    Yes Historical Provider, MD  QUEtiapine (SEROQUEL) 25 MG tablet Take 25 mg by mouth 2 (two) times daily.  12/08/15  Yes Historical Provider, MD  traMADol (ULTRAM) 50 MG tablet Take 1 tablet (50 mg total) by mouth every 6 (six) hours as needed for moderate pain. 12/23/15  Yes Wyatt Hasteavid K Hower, MD  traZODone (DESYREL) 50 MG tablet Take 25 mg by mouth 3 (three) times daily. 09/08/15  Yes Historical Provider, MD  vitamin C (ASCORBIC ACID) 500 MG tablet Take 500 mg by mouth 2 (two) times daily.   Yes  Historical Provider, MD  enoxaparin (LOVENOX) 30 MG/0.3ML injection Inject 0.3 mLs (30 mg total) into the skin daily. Patient not taking: Reported on 01/17/2016 12/24/15 01/07/16  Wyatt Hasteavid K Hower, MD     Allergies Buspirone; Citalopram; Clonazepam; Hydroxyzine; Sertraline; Codeine; Hydrocodone; Neomycin; Oxycodone; Penicillins; and Sulfa antibiotics   Family History  Problem Relation Age of Onset  . Diabetes Mother   . Alzheimer's disease Sister   . Alzheimer's disease Brother     Social History Social History  Substance Use Topics  . Smoking status: Former Games developermoker  .  Smokeless tobacco: Never Used  . Alcohol use No    Review of Systems  Constitutional:   No fever  ENT:   No sore throat. No rhinorrhea. Cardiovascular:   No chest pain. Respiratory:   Positive shortness of breath without cough. Gastrointestinal:   Negative for abdominal pain, vomiting and diarrhea.  Genitourinary:   Positive bowel urine. Musculoskeletal:   Positive left hip pain, subacute  10-point ROS otherwise negative.  ____________________________________________   PHYSICAL EXAM:  VITAL SIGNS: ED Triage Vitals  Enc Vitals Group     BP 01/17/16 1316 102/75     Pulse Rate 01/17/16 1316 (!) 115     Resp 01/17/16 1316 20     Temp 01/17/16 1316 98.1 F (36.7 C)     Temp Source 01/17/16 1316 Axillary     SpO2 01/17/16 1314 100 %     Weight 01/17/16 1316 98 lb (44.5 kg)     Height 01/17/16 1316 5\' 1"  (1.549 m)     Head Circumference --      Peak Flow --      Pain Score --      Pain Loc --      Pain Edu? --      Excl. in GC? --     Vital signs reviewed, nursing assessments reviewed.   Constitutional:   Alert and orientedTo self. Ill-appearing Eyes:   No scleral icterus. No conjunctival pallor. PERRL. EOMI.  No nystagmus. ENT   Head:   Normocephalic and atraumatic.   Nose:   No congestion/rhinnorhea. No septal hematoma   Mouth/Throat:   Dry mucous membranes, no pharyngeal erythema. No peritonsillar mass.    Neck:   No stridor. No SubQ emphysema. No meningismus. Hematological/Lymphatic/Immunilogical:   No cervical lymphadenopathy. Cardiovascular:   Tachycardia heart rate 120. Symmetric bilateral radial and DP pulses.  No murmurs.  Respiratory:   Normal respiratory effort without tachypnea nor retractions. Breath sounds are clear and equal bilaterally. No wheezes/rales/rhonchi. Gastrointestinal:   Soft and nontender. Non distended. There is no CVA tenderness.  No rebound, rigidity, or guarding. Genitourinary:   deferred Musculoskeletal:   Tenderness over  the left hip.. Joints intact Neurologic:   Limited speech  CN 2-10 normal. Motor grossly intact. No gross focal neurologic deficits are appreciated.  Skin:    Skin is warm, dry and intact. Surgical site over left hip clean and intact without inflammatory changes. No drainage. Multiple areas of stage I decubitus ulcers around the pelvis without apparent infection.. No rash noted.  No petechiae, purpura, or bullae.  ____________________________________________    LABS (pertinent positives/negatives) (all labs ordered are listed, but only abnormal results are displayed) Labs Reviewed  CBC - Abnormal; Notable for the following:       Result Value   WBC 13.3 (*)    Hemoglobin 11.8 (*)    RDW 15.2 (*)    All other components within normal limits  LACTIC ACID,  PLASMA - Abnormal; Notable for the following:    Lactic Acid, Venous 2.6 (*)    All other components within normal limits  COMPREHENSIVE METABOLIC PANEL - Abnormal; Notable for the following:    Glucose, Bld 177 (*)    BUN 21 (*)    Calcium 8.4 (*)    Total Protein 5.5 (*)    Albumin 2.5 (*)    ALT 12 (*)    All other components within normal limits  URINALYSIS COMPLETEWITH MICROSCOPIC ([facility] ONLY) - Abnormal; Notable for the following:    Color, Urine YELLOW (*)    APPearance HAZY (*)    Nitrite POSITIVE (*)    Leukocytes, UA 1+ (*)    Bacteria, UA MANY (*)    All other components within normal limits  URINE CULTURE  CULTURE, BLOOD (ROUTINE X 2)  CULTURE, BLOOD (ROUTINE X 2)  LIPASE, BLOOD  LACTIC ACID, PLASMA  CBC  CREATININE, SERUM   ____________________________________________   EKG  Interpreted by me Sinus tachycardia rate 1:15, normal axis intervals. Poor R-wave progression in anterior precordial leads. Normal ST segments and T waves. ____________________________________________    RADIOLOGY  Chest x-ray reveals right lower lobe  pneumonia  ____________________________________________   PROCEDURES Procedures CRITICAL CARE Performed by: Scotty Court, Mariame Rybolt   Total critical care time: 35 minutes  Critical care time was exclusive of separately billable procedures and treating other patients.  Critical care was necessary to treat or prevent imminent or life-threatening deterioration.  Critical care was time spent personally by me on the following activities: development of treatment plan with patient and/or surrogate as well as nursing, discussions with consultants, evaluation of patient's response to treatment, examination of patient, obtaining history from patient or surrogate, ordering and performing treatments and interventions, ordering and review of laboratory studies, ordering and review of radiographic studies, pulse oximetry and re-evaluation of patient's condition.  ____________________________________________   INITIAL IMPRESSION / ASSESSMENT AND PLAN / ED COURSE  Pertinent labs & imaging results that were available during my care of the patient were reviewed by me and considered in my medical decision making (see chart for details).  Patient presents with malaise, clinical dehydration with tachycardia. Not febrile, not clearly septic, however on further workup identified a urinary tract infection as well as pneumonia. Both are healthcare associated with recent surgery. Patient is penicillin allergic so start aztreonam and vancomycin. IV fluids 2 L given. Discussed with hospitalist for admission. CODE STATUS is DO NOT RESUSCITATE.    ----------------------------------------- 2:58 PM on 01/17/2016 -----------------------------------------  Discussed with hospitalist for admission   Clinical Course    ____________________________________________   FINAL CLINICAL IMPRESSION(S) / ED DIAGNOSES  Final diagnoses:  Hospital-acquired pneumonia  Urinary tract infection without hematuria, site  unspecified  Sepsis, due to unspecified organism [facility])       Portions of this note were generated with dragon dictation software. Dictation errors may occur despite best attempts at proofreading.    Sharman Cheek, MD 01/17/16 [phone]

## 2016-01-17 NOTE — Progress Notes (Signed)
   [employer] - [place] at [facility]   Advance care planning  Hospital Day: 0 days Vivien RossettiBarbara Tanksley is a 82 y.o. female presenting with Failure To Thrive .   Advance care planning discussed with patient  with additional Family at bedside. All questions in regards to overall condition and expected prognosis answered. The decision was made to continue current code status  CODE STATUS: full Time spent: 18 minutes

## 2016-01-17 NOTE — Progress Notes (Signed)
Pt admitted to floor, oriented pt and family to room and staff. Assessment completed. On skin assessment with Norberta KeensJulie Rn, pt noted to have stage 3 pressure ulcer on sacrum, as well as stage 1 on bilateral ischium. She also has unstageable sores on bilaterally heels, as well as a stage 1 on the left outer heel. A skin/wound consult has been placed. All areas cleansed and new foam dressings applied.

## 2016-01-18 DIAGNOSIS — L8990 Pressure ulcer of unspecified site, unspecified stage: Secondary | ICD-10-CM | POA: Insufficient documentation

## 2016-01-18 LAB — BASIC METABOLIC PANEL
Anion gap: 4 — ABNORMAL LOW (ref 5–15)
BUN: 17 mg/dL (ref 6–20)
CHLORIDE: 114 mmol/L — AB (ref 101–111)
CO2: 25 mmol/L (ref 22–32)
CREATININE: 0.6 mg/dL (ref 0.44–1.00)
Calcium: 7.6 mg/dL — ABNORMAL LOW (ref 8.9–10.3)
GFR calc Af Amer: >60 mL/min (ref >60)
GFR calc non Af Amer: >60 mL/min (ref >60)
GLUCOSE: 110 mg/dL — AB (ref 65–99)
POTASSIUM: 3.4 mmol/L — AB (ref 3.5–5.1)
SODIUM: 143 mmol/L (ref 135–145)

## 2016-01-18 LAB — CBC
HEMATOCRIT: 25.4 % — AB (ref 35.0–47.0)
Hemoglobin: 8.5 g/dL — ABNORMAL LOW (ref 12.0–16.0)
MCH: 30 pg (ref 26.0–34.0)
MCHC: 33.5 g/dL (ref 32.0–36.0)
MCV: 89.7 fL (ref 80.0–100.0)
PLATELETS: 134 10*3/uL — AB (ref 150–440)
RBC: 2.83 MIL/uL — ABNORMAL LOW (ref 3.80–5.20)
RDW: 15 % — AB (ref 11.5–14.5)
WBC: 8.8 10*3/uL (ref 3.6–11.0)

## 2016-01-18 LAB — MAGNESIUM: Magnesium: 1.9 mg/dL (ref 1.7–2.4)

## 2016-01-18 MED ORDER — ENOXAPARIN SODIUM 30 MG/0.3ML ~~LOC~~ SOLN
30.0000 mg | SUBCUTANEOUS | Status: DC
  Administered 2016-01-18 – 2016-01-19 (×2): 30 mg via SUBCUTANEOUS
  Filled 2016-01-18 (×2): qty 0.3

## 2016-01-18 MED ORDER — POTASSIUM CHLORIDE CRYS ER 20 MEQ PO TBCR
40.0000 meq | EXTENDED_RELEASE_TABLET | Freq: Once | ORAL | Status: AC
  Administered 2016-01-18: 40 meq via ORAL
  Filled 2016-01-18: qty 2

## 2016-01-18 MED ORDER — DEXTROSE 5 % IV SOLN
2.0000 g | INTRAVENOUS | Status: DC
  Administered 2016-01-18 – 2016-01-19 (×2): 2 g via INTRAVENOUS
  Filled 2016-01-18 (×3): qty 2

## 2016-01-18 MED ORDER — COLLAGENASE 250 UNIT/GM EX OINT
TOPICAL_OINTMENT | Freq: Every day | CUTANEOUS | Status: DC
  Administered 2016-01-18: 13:00:00 via TOPICAL
  Administered 2016-01-19: 1 via TOPICAL
  Administered 2016-01-20: 08:00:00 via TOPICAL
  Filled 2016-01-18: qty 30

## 2016-01-18 NOTE — Progress Notes (Signed)
Pharmacy Antibiotic Note  Gilford RileBarbara H Cabana is a 82 y.o. female admitted on 01/17/2016 with pneumonia and UTI.  Pharmacy orginally consulted for Vancomycin and Aztreonam dosing. Now consulted for cefepime dosing.   Patient had hip surg ~4 weeks ago and has been at [name] ?HCAP  Plan: Will start patient on Cefepime 2gm IV every 24 hours based on CrCl <5060ml/ml      Height: 5\' 1"  (154.9 cm) Weight: 98 lb (44.5 kg) IBW/kg (Calculated) : 47.8  Temp (24hrs), Avg:99.6 F (37.6 C), Min:98.7 F (37.1 C), Max:101.1 F (38.4 C)   Recent Labs Lab 01/17/16 1319 01/17/16 1347 01/17/16 1650 01/18/16 0437  WBC 13.3*  --   --  8.8  CREATININE  --  0.83  --  0.60  LATICACIDVEN  --  2.6* 1.6  --     Estimated Creatinine Clearance: 39.4 mL/min (by C-G formula based on SCr of 0.6 mg/dL).    Allergies  Allergen Reactions  . Buspirone Other (See Comments)    UTI and Shakiness  . Citalopram Other (See Comments)    Severe Confusion  . Clonazepam Other (See Comments)    Panic attacks and paranoid  . Hydroxyzine Hives  . Sertraline Other (See Comments)    parkinson's type shaking  . Codeine Rash  . Hydrocodone Rash  . Neomycin Rash  . Oxycodone Rash  . Penicillins Rash    Has patient had a PCN reaction causing immediate rash, facial/tongue/throat swelling, SOB or lightheadedness with hypotension: Yes Has patient had a PCN reaction causing severe rash involving mucus membranes or skin necrosis: No Has patient had a PCN reaction that required hospitalization No Has patient had a PCN reaction occurring within the last 10 years: No If all of the above answers are "NO", then may proceed with Cephalosporin use.  . Sulfa Antibiotics Rash    Antimicrobials this admission: Aztreonam 11/28 >>  11/29 Vancomycin 11/28 >>  11/29 Cefepime 11/29 >>  Dose adjustments this admission:    Microbiology results: 11/28 BCx: Pending 11/28 UCx: Pending    Sputum:      MRSA PCR:    (note last MRSA PCR 12/21/15 was negative )  Thank you for allowing pharmacy to be a part of this patient's care.  Gardner CandleSheema M Dawon Troop, PharmD, BCPS Clinical Pharmacist 01/18/2016 3:27 PM

## 2016-01-18 NOTE — Clinical Social Work Note (Addendum)
Clinical Social Work Assessment  Patient Details  Name: Allison RileBarbara H Pollard MRN: [id number] Date of Birth: [birth year]  Date of referral:  01/18/16               Reason for consult:  Other (Comment Required) (from [facility] )                Permission sought to share information with:  Oceanographeracility Contact Representative Permission granted to share information::  Yes, Verbal Permission Granted  Name::      Skilled Nursing Facility   Agency::   [place]   Relationship::     Contact Information:     Housing/Transportation Living arrangements for the past 2 months:  Skilled Nursing Facility, Single Family Home Source of Information:  Adult Children Patient Interpreter Needed:  None Criminal Activity/Legal Involvement Pertinent to Current Situation/Hospitalization:  No - Comment as needed Significant Relationships:  Adult Children Lives with:  Facility Resident Do you feel safe going back to the place where you live?    Need for family participation in patient care:  Yes (Comment)  Care giving concerns:  Patient was recently placed at [facility] on [facility] for short term rehab and is a readmission.    Social Worker assessment / plan:  Visual merchandiserClinical Social Worker (CSW) reviewed chart and noted that patient is from [facility]. Per [facility] admissions coordinator at [facility] patient is there for short term rehab and can return. Per Verlon AuLeslie patient has been declining and family has been resistant to palliative care. Per [facility] liaison patient has been [facility] by palliative [facility]. Palliative care consult was [facility]. Per RN in progression rounds patient is currently not eating. CSW attempted to meet with patient however she could not speak or answer any questions. CSW contacted patient's daughter Aram BeechamCynthia. Per daughter patient has been at [facility] for 2 weeks for short term rehab and was up walking and doing PT. Daughter reported that patient  has declined in the recent days because of a UTI and is not eating. Per daughter she and her sister Olegario MessierKathy and brother Casimiro NeedleMichael share HPOA. Per Aram BeechamCynthia she lives the closest to [place] in [place]. Per daughter patient was living alone in [place] before going to [facility]. Daughter is agreeable for patient to return to [facility] once stable.   FL2 complete. CSW will continue to follow and assist as needed.  CSW left patient's daughter Olegario MessierKathy a Engineer, technical salesvoicemail. Olegario MessierKathy called CSW back and was made aware of above. Olegario MessierKathy prefers for patient to return to [facility].   Employment status:  Retired Database administratornsurance information:  Managed Medicare PT Recommendations:  Not assessed at this time Information / Referral to community resources:  Skilled Nursing Facility  Patient/Family's Response to care:  Patient could not participate in assessment. Daughter Aram BeechamCynthia would like for patient to return to [facility].   Patient/Family's Understanding of and Emotional Response to Diagnosis, Current Treatment, and Prognosis: Patient's daughter was very familiar with patient's diagnosis of dementia and parkinson's. Daughter reported that patient has recently declined and is not eating.   Emotional Assessment Appearance:  Appears stated age Attitude/Demeanor/Rapport:  Unable to Assess Affect (typically observed):  Unable to Assess Orientation:  Oriented to Self, Fluctuating Orientation (Suspected and/or reported Sundowners) Alcohol / Substance use:  Not Applicable Psych involvement (Current and /or in the community):  No (Comment)  Discharge Needs  Concerns to be addressed:  Discharge Planning Concerns Readmission within the last 30  days:  Yes Current discharge risk:  Dependent with Mobility, Cognitively Impaired, Chronically ill Barriers to Discharge:  Continued Medical Work up   [employer], Darleen CrockerBailey M, LCSW 01/18/2016, 11:50 AM

## 2016-01-18 NOTE — Evaluation (Addendum)
Clinical/Bedside Swallow Evaluation Patient Details  Name: Allison RileBarbara H Scaglione MRN: [id number] Date of Birth: [birth year]  Today's Date: 01/18/2016 Time: SLP Start Time (ACUTE ONLY): 1145 SLP Stop Time (ACUTE ONLY): 1245 SLP Time Calculation (min) (ACUTE ONLY): 60 min  Past Medical History:  Past Medical History:  Diagnosis Date  . Dementia   . Parkinson disease [facility](HCC)    Past Surgical History:  Past Surgical History:  Procedure Laterality Date  . ANTERIOR APPROACH HEMI HIP ARTHROPLASTY Left 12/22/2015   Procedure: ANTERIOR APPROACH HEMI HIP ARTHROPLASTY;  Surgeon: Kennedy BuckerMichael Menz, MD;  Location: [facility] ORS;  Service: Orthopedics;  Laterality: Left;  . CHOLECYSTECTOMY     HPI: Pt is is a 82 y.o. female with a known history of Dementia, recent left hip arthroplasty performed 12/22/2015 who is presenting from the nursing facility or "failure to thrive" the patient is unable to provide any information history taken from daughter present at bedside. States over the last week she has made a significant decline not eating drinking more confused and was brought to Hospital further workup and evaluation. Pt is currently awake w/ eyes intermittently closing; mumbled speech.       Assessment / Plan / Recommendation Clinical Impression  Pt appears at increased risk for aspiration secondary to the moderate oral phase deficits noted and suspected pharyngeal phase dysphagia risk; also d/t baseline Cognitive status and presentation. Pt accepted a few trials of Nectar liquids via TSP and 1/2 tsp trials of Puree. She exhibited oral phase deficits c/b decreased labial closure around utensil and reduced oral movements for bolus control and management; anterior leakage noted moreso from the R side(leaning to the R side). Audible swallows noted w/ the TSP trials of Nectar liquids but no overt coughing(delayed/immediate). During the oral phase, pt exhibited moderate+ deficits including reduced awareness for A-P transfer,  and pt had to be given time and cues to transfer and swallow - pt expectorated puree bolus x1/3 after lengthy holding and discoordination/discomfort noted. Suspect pt's presentation could be related to baseline diagnosis of Dementia, and of Parkinson's Disease.  Recommend a Dysphagia level 1 diet w/ Nectar liquids (as a trial) w/ strict aspiration precautions and feeding support - pt may benefit from a modified, Dysphagia diet at discharge d/t decline in status.     Aspiration Risk  Moderate aspiration risk    Diet Recommendation  Dysphagia level 1 w/ Nectar consistency liquids; strict aspiration precautions; feeding assistance at all meals.   Medication Administration: Crushed with puree (as able)    Other  Recommendations Recommended Consults:  (Dietician; Palliative care consult) Oral Care Recommendations: Oral care BID;Staff/trained caregiver to provide oral care Other Recommendations: Order thickener from pharmacy;Prohibited food (jello, ice cream, thin soups);Remove water pitcher;Have oral suction available   Follow up Recommendations Skilled Nursing facility (TBD)      Frequency and Duration min 3x week  2 weeks       Prognosis Prognosis for Safe Diet Advancement: Guarded Barriers to Reach Goals: Cognitive deficits;Severity of deficits      Swallow Study   General Date of Onset: 01/17/16 Type of Study: Bedside Swallow Evaluation Previous Swallow Assessment: none indicated  Diet Prior to this Study: Regular Temperature Spikes Noted: Yes (wbc not elevated) Respiratory Status: Room air History of Recent Intubation: No Behavior/Cognition: Confused;Distractible;Requires cueing;Doesn't follow directions Oral Cavity Assessment:  (unable to assess fully d/t Cognitive status/decline) Oral Care Completed by SLP: Recent completion by staff Oral Cavity - Dentition:  (few dentition noted) Vision:  (n/a) Self-Feeding Abilities:  Total assist Patient Positioning: Upright in  bed Baseline Vocal Quality: Low vocal intensity (mumbled speech) Volitional Cough: Cognitively unable to elicit Volitional Swallow: Unable to elicit    Oral/Motor/Sensory Function Overall Oral Motor/Sensory Function:  (unable to fully assess d/t Cognitive status; decline)   Ice Chips Ice chips: Not tested   Thin Liquid Thin Liquid: Not tested    Nectar Thick Nectar Thick Liquid: Impaired Presentation: Spoon (fed; 8 trials) Oral Phase Impairments: Reduced labial seal;Reduced lingual movement/coordination;Poor awareness of bolus Oral phase functional implications: Right anterior spillage (leaning R) Pharyngeal Phase Impairments: Suspected delayed Swallow (audible swallows) Other Comments: no overt s/s of aspiration noted w/ trials   Honey Thick Honey Thick Liquid: Not tested   Puree Puree: Impaired Presentation: Spoon (fed; 5 trials) Oral Phase Impairments: Reduced labial seal;Reduced lingual movement/coordination;Poor awareness of bolus Oral Phase Functional Implications: Right lateral sulci pocketing;Prolonged oral transit;Oral residue;Oral holding (reduced A-P transfer coordination w/ texture) Pharyngeal Phase Impairments: Suspected delayed Swallow;Cough - Delayed (poor coordination of the swallow)   Solid   GO   Solid: Not tested         Jerilynn SomKatherine Drelyn Pistilli, MS, CCC-SLP [place] Dorantes 01/18/2016,3:14 PM

## 2016-01-18 NOTE — Consult Note (Signed)
WOC Nurse wound consult note Reason for Consult:Unstageable pressure injury to sacrum.  Bilateral deep tissue injury to heels.  Stage 1 pressure injury to left lateral malleolus.  Ischial wounds are resolving, but still pink and blanchable.  Patient is currently positioned off of these areas, on right side.  Left hips arthroplasty with 10 cm row of steristrips clean, dry and intact.  Albumin is 2.5, diagnosis of failure to thrive and pneumonia.  Currently full code.   Wound type: Full  thickness pressure injuries, present on admission.  Pressure Ulcer POA: Yes Measurement:Left malleolus:  1 cm diameter nonblanchable redness. Sacrum 4 cm x 3 cm x 0.2 cm with 100% devitalized tissue to wound bed.   Bilateral heels with 1 cm x 1 cm intact maroon discoloration, heel protectors in place.  Wound ZOX:WRUEAVbed:sacrum:  Devitalized tissue. Will begin enzymatic debridement.  Drainage (amount, consistency, odor) Scant serosanguinous to sacrum.  Periwound:intact.  Dry skin noted.  Dressing procedure/placement/frequency:Cleanse sacral wound with NS and pat gently dry.  Apply Santyl to wound bed.  Cover with NS moist gauze.  Secure with ABD pad and tape.  Change daily.   Silicone border foam dressing to left malleolus, bilateral heels. CHange every 3 days and PRN soilage.   Turn and reposition every two hours. Avoid disposable briefs and underpads.    Offload heels.   Will not follow at this time.  Please re-consult if needed.  Maple HudsonKaren Konstance Happel RN BSN CWON Pager [phone]

## 2016-01-18 NOTE — Progress Notes (Addendum)
Patient has not voided today, bladder scan performed and showed 400 mL. Dr. Allena KatzPatel notified and ordered for in and out cath if patient can not void within next 45 min. Trudee KusterBrandi R Mansfield   ** During am meds Dr. Imogene Burnhen ordered to hold protonix and colace since they could not be crushed. Meds held. Trudee KusterBrandi R Mansfield

## 2016-01-18 NOTE — Progress Notes (Addendum)
[facility] - [place] at [facility]   PATIENT NAME: Allison Pollard    MR#:  [id number]  DATE OF BIRTH:  26-Dec-1933  SUBJECTIVE:  CHIEF COMPLAINT:   Chief Complaint  Patient presents with  . Failure To Thrive    REVIEW OF SYSTEMS:  Review of Systems  Unable to perform ROS: Dementia    DRUG ALLERGIES:   Allergies  Allergen Reactions  . Buspirone Other (See Comments)    UTI and Shakiness  . Citalopram Other (See Comments)    Severe Confusion  . Clonazepam Other (See Comments)    Panic attacks and paranoid  . Hydroxyzine Hives  . Sertraline Other (See Comments)    parkinson's type shaking  . Codeine Rash  . Hydrocodone Rash  . Neomycin Rash  . Oxycodone Rash  . Penicillins Rash    Has patient had a PCN reaction causing immediate rash, facial/tongue/throat swelling, SOB or lightheadedness with hypotension: Yes Has patient had a PCN reaction causing severe rash involving mucus membranes or skin necrosis: No Has patient had a PCN reaction that required hospitalization No Has patient had a PCN reaction occurring within the last 10 years: No If all of the above answers are "NO", then may proceed with Cephalosporin use.  . Sulfa Antibiotics Rash   VITALS:  Blood pressure (!) 94/46, pulse 92, temperature 99.5 F (37.5 C), temperature source Axillary, resp. rate (!) 21, height 5\' 1"  (1.549 m), weight 98 lb (44.5 kg), SpO2 94 %. PHYSICAL EXAMINATION:  Physical Exam  Constitutional: No distress.  HENT:  Head: Normocephalic.  Eyes: Conjunctivae and EOM are normal. No scleral icterus.  Neck: Normal range of motion. Neck supple. No tracheal deviation present.  Cardiovascular: Normal rate, regular rhythm and normal heart sounds.  Exam reveals no gallop.   No murmur heard. Pulmonary/Chest: Effort normal and breath sounds normal. No respiratory distress. She has no wheezes. She has no rales.  Abdominal: Soft. Bowel sounds are normal. She exhibits no distension.  There is no tenderness.  Musculoskeletal: Normal range of motion. She exhibits no edema or tenderness.  Neurological:  Demented, does not follow commands, unable to exam.  Skin: No rash noted. No erythema.  Sacral DU, unstageable.  Psychiatric:  Demented.   LABORATORY PANEL:   CBC  Recent Labs Lab 01/18/16 0437  WBC 8.8  HGB 8.5*  HCT 25.4*  PLT 134*   ------------------------------------------------------------------------------------------------------------------ Chemistries   Recent Labs Lab 01/17/16 1347 01/18/16 0437  NA 141 143  K 4.9 3.4*  CL 107 114*  CO2 25 25  GLUCOSE 177* 110*  BUN 21* 17  CREATININE 0.83 0.60  CALCIUM 8.4* 7.6*  MG  --  1.9  AST 31  --   ALT 12*  --   ALKPHOS 54  --   BILITOT 1.0  --    RADIOLOGY:  No results found. ASSESSMENT AND PLAN:   82 year old Caucasian female history of Parkinson's and dementia presenting with failure to thrive  1.Sepsis with UTI and PNA (RLL), healthcare associated pneumonia.  She is treated wtih vancomycin and Azactam.  Change to cefepime and discontinue vancomycin due to negative MRSA. Follow-up blood culture. Leukocytosis improved.  2. Hyperthyroidism Tapazole check thyroid function tests 3. GERD without esophagitis PPI therapy 4. Alzheimer's dementia Aricept. Aspiration and fall precaution.  Hypokalemia. Given potassium supplement.  Anemia. Hemoglobin decreased from 11.8 to 8.5. Possible due to IV fluid dilution, but need anemia workup. Follow-up hemoglobin. Failure to thrive and malnutrition. Dietitian consult. Sacral DU.  Unstageable. On care.  PT evaluation suggested nursing home placement.  All the records are reviewed and case discussed with Care Management/Social Worker. Management plans discussed with the patient, family and they are in agreement.  CODE STATUS: Full code  TOTAL TIME TAKING CARE OF THIS PATIENT: 38minutes.   More than 50% of the time was spent in  counseling/coordination of care: YES  POSSIBLE D/C IN 2-3 DAYS, DEPENDING ON CLINICAL CONDITION.   Allison Pollard, Allison Pollard M.D on 01/18/2016 at 4:14 PM  Between 7am to 6pm - Pager - [phone]  After 6pm go to [email or web address] - Social research officer, governmentpassword [email or web address] [facility]  [facility] [place] [facility]  Office  [phone]  CC: Primary care physician; HART, Charlton AmorLAURA CHRISTINE, MD  Note: This dictation was prepared with Dragon dictation along with smaller phrase technology. Any transcriptional errors that result from this process are unintentional.

## 2016-01-18 NOTE — Evaluation (Signed)
Physical Therapy Evaluation Patient Details Name: Allison RileBarbara H Andrepont MRN: [id number] DOB: 12-29-1933 Today's Date: 01/18/2016   History of Present Illness  Pt is a 82 y.o. female brought to the ED due to not eating or drinking and generalized weakness. She recently had L THA with anterior approach about 4 weeks ago. She was at [facility] for rehabilitation where things had been going well, but over the last 3 or 4 days she has not been eating or drinking at all. She's become progressively weaker with this. Patient also reports shortness of breath without cough.  Pt diagnosed with possible PNA, sepsis, and pressure ulcers on sacrum, ischials, and L lateral malleolus.   Clinical Impression  Pt presents with deficits in strength, transfers, mobility, gait, balance, and activity tolerance.  Pt lethargic and very weak with max/total A required for all bed mobility tasks.  Pt required mod A to sit at EOB to prevent LOB with HR increasing from 95 to 104 bpm after going from sup to sitting.  Pt unable to stand or ambulate secondary to pronounced weakness and lethargy.  Was able to elicit some AROM at pt's R ankle but not on the left.  Pt also appears to have a flexion contracture in the R knee.  Pt's lethargy/cognition made full assessment difficult.  Pt will benefit from PT services to address above deficits for decreased caregiver assistance upon discharge.      Follow Up Recommendations SNF    Equipment Recommendations   (Unsure of pt needs secondary to pt cognition)    Recommendations for Other Services       Precautions / Restrictions Precautions Precautions: Fall;Anterior Hip Precaution Booklet Issued: No Precaution Comments: L hip precautions from prior admission on 11//2/17 Restrictions Weight Bearing Restrictions: Yes LLE Weight Bearing: Weight bearing as tolerated      Mobility  Bed Mobility Overal bed mobility: Needs Assistance Bed Mobility: Supine to Sit;Sit to Supine      Supine to sit: Max assist;Total assist Sit to supine: Max assist;Total assist      Transfers                 General transfer comment: Pt unable to stand  Ambulation/Gait             General Gait Details: Pt unable to ambulate  Stairs            Wheelchair Mobility    Modified Rankin (Stroke Patients Only)       Balance Overall balance assessment: Needs assistance Sitting-balance support: Bilateral upper extremity supported Sitting balance-Leahy Scale: Poor Sitting balance - Comments: Primarily mod A with occasional Min A/CGA to prevent LOB in static sitting Postural control: Posterior lean;Right lateral lean                                   Pertinent Vitals/Pain Pain Assessment: No/denies pain    Home Living Family/patient expects to be discharged to:: Private residence Living Arrangements: Alone Available Help at Discharge: Personal care attendant;Available 24 hours/day Type of Home: House Home Access: Stairs to enter Entrance Stairs-Rails: Doctor, general practiceight;Left Entrance Stairs-Number of Steps: 2 Home Layout: One level   Additional Comments: No family present to assist with history, unsure of accuracy    Prior Function Level of Independence: Needs assistance   Gait / Transfers Assistance Needed: Ind amb with a walker in home  ADL's / Homemaking Assistance Needed: Aide assists with ADLs  and food prep        Hand Dominance        Extremity/Trunk Assessment   Upper Extremity Assessment: Generalized weakness           Lower Extremity Assessment: Generalized weakness;RLE deficits/detail;LLE deficits/detail RLE Deficits / Details: R knee flex contracture with pt lacking 20-30 deg of R knee ext grossly; attempts at PROM elicit grimacing  LLE Deficits / Details: Pt unable to DF or PF L ankle, PROM WFL; difficult to assess with high degree of certainty what pt is able to do secondary to cognition/lethargy.     Communication    Communication: Other (comment) (Difficult to understand pt, weak and soft spoken)  Cognition Arousal/Alertness: Lethargic Behavior During Therapy: Flat affect Overall Cognitive Status: No family/caregiver present to determine baseline cognitive functioning                      General Comments      Exercises Total Joint Exercises Ankle Circles/Pumps: PROM;AAROM;Right;Left;10 reps;15 reps (PROM on L) Short Arc Quad: AAROM;Both;10 reps;15 reps;PROM Heel Slides: AAROM;Both;PROM;10 reps;15 reps Hip ABduction/ADduction: PROM;Right;10 reps Straight Leg Raises: AAROM;PROM;Both;10 reps   Assessment/Plan    PT Assessment Patient needs continued PT services  PT Problem List Decreased strength;Decreased activity tolerance;Decreased balance;Decreased mobility          PT Treatment Interventions Gait training;Functional mobility training;Therapeutic activities;Therapeutic exercise;Balance training;Neuromuscular re-education;Patient/family education;DME instruction    PT Goals (Current goals can be found in the Care Plan section)  Acute Rehab PT Goals PT Goal Formulation: Patient unable to participate in goal setting Time For Goal Achievement: 01/31/16 Potential to Achieve Goals: Fair    Frequency Min 2X/week   Barriers to discharge        Co-evaluation               End of Session Equipment Utilized During Treatment: Gait belt Activity Tolerance: Patient limited by fatigue;Patient limited by lethargy Patient left: in bed;with bed alarm set;with call bell/phone within reach (Heel foam boots donned, pt left in R side-lying secondary to wounds)           Time: 1010-1041 PT Time Calculation (min) (ACUTE ONLY): 31 min   Charges:   PT Evaluation $PT Eval Moderate Complexity: 1 Procedure PT Treatments $Therapeutic Exercise: 8-22 mins   PT G Codes:        Elly Modena. Scott Nabila Albarracin PT, DPT 01/18/16, 11:59 AM

## 2016-01-18 NOTE — Progress Notes (Signed)
Wound nurse called and re consulted since there are no orders for malodorous wound to R heel. Purulent drainage present. Allison Pollard

## 2016-01-18 NOTE — [place] FL2 (Signed)
[place] MEDICAID FL2 LEVEL OF CARE SCREENING TOOL     IDENTIFICATION  Patient Name: Allison RileBarbara H Pollard Birthdate: Jan 24, 1934 Sex: female Admission Date (Current Location): 01/17/2016  [place]   Facility and Address:  [facility], [address], [place], [place] [address]      Provider Number: [id number]  Attending Physician Name and Address:  Shaune PollackQing Mardy Hoppe, MD  Relative Name and Phone Number:       Current Level of Care: Hospital Recommended Level of Care: Skilled Nursing Facility Prior Approval Number:    Date Approved/Denied:   PASRR Number:  [id number])  Discharge Plan: SNF    Current Diagnoses: Patient Active Problem List   Diagnosis Date Noted  . Sepsis (HCC) 01/17/2016  . Closed left hip fracture (HCC) 12/21/2015    Orientation RESPIRATION BLADDER Height & Weight     Self  Normal Incontinent Weight: 98 lb (44.5 kg) Height:  5\' 1"  (154.9 cm)  BEHAVIORAL SYMPTOMS/MOOD NEUROLOGICAL BOWEL NUTRITION STATUS   (none)  (Parkinson's disease ) Continent Diet (Diet: DYS 1 )  AMBULATORY STATUS COMMUNICATION OF NEEDS Skin   Extensive Assist Verbally Normal                       Personal Care Assistance Level of Assistance  Bathing, Feeding, Dressing Bathing Assistance: Limited assistance Feeding assistance: Limited assistance Dressing Assistance: Limited assistance     Functional Limitations Info  Sight, Hearing, Speech Sight Info: Adequate Hearing Info: Adequate Speech Info: Adequate    SPECIAL CARE FACTORS FREQUENCY  PT (By licensed PT), OT (By licensed OT)     PT Frequency:  (5) OT Frequency:  (5)            Contractures      Additional Factors Info  Code Status, Allergies Code Status Info:  (Full Code. ) Allergies Info:  (Buspirone, Citalopram, Clonazepam, Hydroxyzine, Sertraline, Codeine, Hydrocodone, Neomycin, Oxycodone, Penicillins, Sulfa Antibiotics)           Current  Medications (01/18/2016):  This is the current hospital active medication list Current Facility-Administered Medications  Medication Dose Route Frequency Provider Last Rate Last Dose  . 0.9 %  sodium chloride infusion   Intravenous Continuous Wyatt Hasteavid K Hower, MD 75 mL/hr at 01/17/16 1742    . acetaminophen (TYLENOL) tablet 650 mg  650 mg Oral Q6H PRN Wyatt Hasteavid K Hower, MD       Or  . acetaminophen (TYLENOL) suppository 650 mg  650 mg Rectal Q6H PRN Wyatt Hasteavid K Hower, MD   650 mg at 01/18/16 0016  . aztreonam (AZACTAM) 2 g in dextrose 5 % 50 mL IVPB  2 g Intravenous Q8H Sharman CheekPhillip Stafford, MD   2 g at 01/18/16 0542  . carbidopa-levodopa (SINEMET IR) 25-100 MG per tablet immediate release 0.5 tablet  0.5 tablet Oral TID Wyatt Hasteavid K Hower, MD   0.5 tablet at 01/18/16 [id number]  . cholecalciferol (VITAMIN D) tablet 2,000 Units  2,000 Units Oral BH-q7a Wyatt Hasteavid K Hower, MD      . collagenase (SANTYL) ointment   Topical Daily Shaune PollackQing Gracemarie Skeet, MD      . docusate sodium (COLACE) capsule 100 mg  100 mg Oral BID Wyatt Hasteavid K Hower, MD      . donepezil (ARICEPT) tablet 10 mg  10 mg Oral QHS Wyatt Hasteavid K Hower, MD      . enoxaparin (LOVENOX) injection 40 mg  40 mg Subcutaneous Q24H Wyatt Hasteavid K Hower, MD   40  mg at 01/17/16 1743  . methimazole (TAPAZOLE) tablet 2.5 mg  2.5 mg Oral BH-q7a Wyatt Hasteavid K Hower, MD      . methocarbamol (ROBAXIN) tablet 500 mg  500 mg Oral Q6H PRN Wyatt Hasteavid K Hower, MD      . mirtazapine (REMERON) tablet 30 mg  30 mg Oral QHS Wyatt Hasteavid K Hower, MD      . ondansetron [facility](ZOFRAN) tablet 4 mg  4 mg Oral Q6H PRN Wyatt Hasteavid K Hower, MD       Or  . ondansetron [facility](ZOFRAN) injection 4 mg  4 mg Intravenous Q6H PRN Wyatt Hasteavid K Hower, MD      . pantoprazole (PROTONIX) EC tablet 40 mg  40 mg Oral Daily Wyatt Hasteavid K Hower, MD   40 mg at 01/17/16 1743  . QUEtiapine (SEROQUEL) tablet 25 mg  25 mg Oral BID Wyatt Hasteavid K Hower, MD   25 mg at 01/18/16 0951  . traMADol (ULTRAM) tablet 50 mg  50 mg Oral Q6H PRN Wyatt Hasteavid K Hower, MD      . traZODone (DESYREL) tablet 25 mg  25 mg  Oral TID Wyatt Hasteavid K Hower, MD   25 mg at 01/18/16 0951  . vancomycin (VANCOCIN) IVPB 750 mg/150 ml premix  750 mg Intravenous Q24H Sharman CheekPhillip Stafford, MD      . vitamin C (ASCORBIC ACID) tablet 500 mg  500 mg Oral BID Wyatt Hasteavid K Hower, MD   500 mg at 01/18/16 [id number]     Discharge Medications: Please see discharge summary for a list of discharge medications.  Relevant Imaging Results:  Relevant Lab Results:   Additional Information  (SSN: [id number])  Sample, Darleen CrockerBailey M, LCSW

## 2016-01-19 DIAGNOSIS — Z7189 Other specified counseling: Secondary | ICD-10-CM

## 2016-01-19 DIAGNOSIS — Z515 Encounter for palliative care: Secondary | ICD-10-CM

## 2016-01-19 DIAGNOSIS — A419 Sepsis, unspecified organism: Principal | ICD-10-CM

## 2016-01-19 LAB — IRON AND TIBC
Iron: 9 ug/dL — ABNORMAL LOW (ref 28–170)
Saturation Ratios: 9 % — ABNORMAL LOW (ref 10.4–31.8)
TIBC: 101 ug/dL — ABNORMAL LOW (ref 250–450)
UIBC: 92 ug/dL

## 2016-01-19 LAB — URINE CULTURE

## 2016-01-19 LAB — HEMOGLOBIN: HEMOGLOBIN: 8.7 g/dL — AB (ref 12.0–16.0)

## 2016-01-19 LAB — POTASSIUM: POTASSIUM: 3.8 mmol/L (ref 3.5–5.1)

## 2016-01-19 MED ORDER — FERROUS SULFATE 300 (60 FE) MG/5ML PO SYRP
300.0000 mg | ORAL_SOLUTION | Freq: Three times a day (TID) | ORAL | Status: DC
  Filled 2016-01-19: qty 5

## 2016-01-19 MED ORDER — MORPHINE SULFATE (PF) 2 MG/ML IV SOLN: 1.0000 mg | INTRAVENOUS | Status: DC | PRN

## 2016-01-19 MED ORDER — PANTOPRAZOLE SODIUM 40 MG PO PACK
40.0000 mg | PACK | Freq: Every day | ORAL | Status: DC
  Filled 2016-01-19: qty 20

## 2016-01-19 MED ORDER — FERROUS SULFATE 220 (44 FE) MG/5ML PO ELIX
300.0000 mg | ORAL_SOLUTION | Freq: Three times a day (TID) | ORAL | Status: DC
  Administered 2016-01-20: 300 mg via ORAL
  Filled 2016-01-19 (×4): qty 6.9

## 2016-01-19 MED ORDER — PANTOPRAZOLE SODIUM 40 MG IV SOLR
40.0000 mg | INTRAVENOUS | Status: DC
  Administered 2016-01-19: 40 mg via INTRAVENOUS
  Filled 2016-01-19: qty 40

## 2016-01-19 MED ORDER — DOCUSATE SODIUM 50 MG/5ML PO LIQD
100.0000 mg | Freq: Two times a day (BID) | ORAL | Status: DC
  Filled 2016-01-19: qty 10

## 2016-01-19 NOTE — Progress Notes (Addendum)
[facility] [place] [facility]   PATIENT NAME: Allison RossettiBarbara Pollard    MR#:  [id number]  DATE OF BIRTH:  [birth year]  SUBJECTIVE:  CHIEF COMPLAINT:   Chief Complaint  Patient presents with  . Failure To Thrive   The patient is demented and noncommunicative. Poor oral intake per RN. REVIEW OF SYSTEMS:  Review of Systems  Unable to perform ROS: Dementia    DRUG ALLERGIES:   Allergies  Allergen Reactions  . Buspirone Other (See Comments)    UTI and Shakiness  . Citalopram Other (See Comments)    Severe Confusion  . Clonazepam Other (See Comments)    Panic attacks and paranoid  . Hydroxyzine Hives  . Sertraline Other (See Comments)    parkinson's type shaking  . Codeine Rash  . Hydrocodone Rash  . Neomycin Rash  . Oxycodone Rash  . Penicillins Rash    Has patient had a PCN reaction causing immediate rash, facial/tongue/throat swelling, SOB or lightheadedness with hypotension: Yes Has patient had a PCN reaction causing severe rash involving mucus membranes or skin necrosis: No Has patient had a PCN reaction that required hospitalization No Has patient had a PCN reaction occurring within the last 10 years: No If all of the above answers are "NO", then may proceed with Cephalosporin use.  . Sulfa Antibiotics Rash   VITALS:  Blood pressure 122/65, pulse (!) 101, temperature 98.3 F (36.8 C), temperature source Oral, resp. rate 18, height 5\' 1"  (1.549 m), weight 98 lb (44.5 kg), SpO2 95 %. PHYSICAL EXAMINATION:  Physical Exam  Constitutional: No distress.  HENT:  Head: Normocephalic.  Eyes: Conjunctivae and EOM are normal. No scleral icterus.  Neck: Normal range of motion. Neck supple. No tracheal deviation present.  Cardiovascular: Normal rate, regular rhythm and normal heart sounds.  Exam reveals no gallop.   No murmur heard. Pulmonary/Chest: Effort normal and breath sounds normal. No respiratory distress. She has no wheezes. She has no rales.    Abdominal: Soft. Bowel sounds are normal. She exhibits no distension. There is no tenderness.  Musculoskeletal: Normal range of motion. She exhibits no edema or tenderness.  Neurological:  Demented, does not follow commands, unable to exam.  Skin: No rash noted. No erythema.  Sacral DU, unstageable.  Psychiatric:  Demented.   LABORATORY PANEL:   CBC  Recent Labs Lab 01/18/16 0437 01/19/16 0343  WBC 8.8  --   HGB 8.5* 8.7*  HCT 25.4*  --   PLT 134*  --    ------------------------------------------------------------------------------------------------------------------ Chemistries   Recent Labs Lab 01/17/16 1347 01/18/16 0437 01/19/16 0343  NA 141 143  --   K 4.9 3.4* 3.8  CL 107 114*  --   CO2 25 25  --   GLUCOSE 177* 110*  --   BUN 21* 17  --   CREATININE 0.83 0.60  --   CALCIUM 8.4* 7.6*  --   MG  --  1.9  --   AST 31  --   --   ALT 12*  --   --   ALKPHOS 54  --   --   BILITOT 1.0  --   --    RADIOLOGY:  No results found. ASSESSMENT AND PLAN:   82 year old Caucasian female history of Parkinson's and dementia presenting with failure to thrive  1.Sepsis with UTI and PNA (RLL), healthcare associated pneumonia.  She is treated wtih vancomycin and Azactam.  Changed to cefepime and discontinued vancomycin due to negative MRSA.  Follow-up blood culture. Leukocytosis improved. Urine cultures showed Escherichia coli, sensitive to Rocephin.  2. Hyperthyroidism Tapazole. TSH 0.376, T4 1.1. 3. GERD without esophagitis PPI therapy 4. Alzheimer's dementia Aricept. Aspiration and fall precaution.  Hypokalemia. Given potassium supplement. Improved.  Anemia. Hemoglobin decreased from 11.8 to 8.5. Possible due to IV fluid dilution, anemia workup: Iron deficiency. Follow-up hemoglobin is stable. Start ferrous sulfate. Failure to thrive and malnutrition. Dietitian consult. Sacral DU. Unstageable. On care.  PT evaluation suggested nursing home placement.  poor  prognosis.  The patient has 3 children but no POA, palliative care team is following. Per palliative care team, the patient's CODE STATUS is changed to DO NOT RESUSCITATE after discussion this family members. Comfort is the main goal and focus but final members wish to continue IV fluid and antibiotics for now. Considering hospice options.  I discussed with palliative care team. All the records are reviewed and case discussed with Care Management/Social Worker. Management plans discussed with the patient, family and they are in agreement.  CODE STATUS: DNR  TOTAL TIME TAKING CARE OF THIS PATIENT: 39 minutes.   More than 50% of the time was spent in counseling/coordination of care: YES  POSSIBLE D/C IN 2-3 DAYS, DEPENDING ON CLINICAL CONDITION.   Allison Pollard, Allison Pollard M.D on 01/19/2016 at 2:45 PM  Between 7am to 6pm - Pager - [phone]  After 6pm go to [email or web address] - Social research officer, governmentpassword [email or web address] [email or web address]  [facility]  Office  [phone]  CC: Primary care physician; HART, Allison AmorLAURA CHRISTINE, MD  Note: This dictation was prepared with Dragon dictation along with smaller phrase technology. Any transcriptional errors that result from this process are unintentional.

## 2016-01-19 NOTE — Progress Notes (Signed)
Initial Nutrition Assessment  DOCUMENTATION CODES:   Severe malnutrition in context of chronic illness  INTERVENTION:  1. Mighty Shake II BID with meals, each supplement provides 480-500 kcals and 20-23 grams of protein 2. NDD1-Nectar Thick per SLP  NUTRITION DIAGNOSIS:   Malnutrition related to chronic illness as evidenced by severe depletion of muscle mass, severe depletion of body fat.  GOAL:   Patient will meet greater than or equal to 90% of their needs  MONITOR:   PO intake, Supplement acceptance, I & O's, Labs, Weight trends  REASON FOR ASSESSMENT:   Consult Assessment of nutrition requirement/status  ASSESSMENT:   Allison Pollard  is a 82 y.o. female with a known history of Dementia, recent left hip arthroplasty performed 12/22/2015 who is presenting from the nursing facility or "failure to thrive"  Spoke with Allison Pollard briefly. She was unable to provide accurate history. Nutrition-Focused physical exam completed. Findings are severe fat depletion, severe muscle depletion, and no edema.  Per chart, exhibits a 7#/6.7% severe wt loss over 1 month. Did not eat anything this morning. Palliative consult pending given pt's dementia and poor intake  Labs and medications reviewed: Vitamin D, Colace, Remeron, Ascorbic Acid NS @ 7475mL/hr   Diet Order:  DIET - DYS 1 Room service appropriate? No; Fluid consistency: Nectar Thick  Skin:   Stg II and I to Back, Unstagables on Sacrum and Heel, Stg II to Ankle  Last BM:  01/15/2016  Height:   Ht Readings from Last 1 Encounters:  01/17/16 5\' 1"  (1.549 m)    Weight:   Wt Readings from Last 1 Encounters:  01/17/16 98 lb (44.5 kg)    Ideal Body Weight:  47.72 kg  BMI:  Body mass index is 18.52 kg/m.  Estimated Nutritional Needs:   Kcal:  1113-1350 calories  Protein:  45-54 gm  Fluid:  >/= 1.1L  EDUCATION NEEDS:   No education needs identified at this time  Dionne AnoWilliam M. Virgle Arth, MS, RD LDN Inpatient Clinical  Dietitian Pager [phone]

## 2016-01-19 NOTE — Consult Note (Signed)
WOC Nurse wound consult note Reason for Consult: Unstageable pressure injury to right heel.  Malodorous.  Wound type:Unstageable pressure injury Pressure Ulcer POA: Yes Measurement: 2cm x 3 cm wound bed is dark, 100% devitalized tissue Drainage noted today with necrotic odor.  Wound bed:100% devitalized tissue. Drainage (amount, consistency, odor) Minimal serosanguinous drainage.  Periwound:Dry skin  Heel protectors in place.   Dressing procedure/placement/frequency: Cleanse right heel with NS and pat gently dry.  Apply Santyl to wound bed. Cover with NS moist gauze.  Secure with 4x4 gauze and kerlix/tape.  Change daily.  Will not follow at this time.  Please re-consult if needed.  Maple HudsonKaren Cathy Crounse RN BSN CWON Pager [phone]

## 2016-01-19 NOTE — Consult Note (Signed)
Consultation Note Date: 01/19/2016   Patient Name: Allison Pollard  DOB: [birth year]  MRN: [id number]  Age / Sex: 82 y.o., female  PCP: Rochel Brome, MD Referring Physician: Demetrios Loll, MD  Reason for Consultation: Establishing goals of care  HPI/Patient Profile: 82 y.o. female  with past medical history of dementia, Parkinson's disease, recent left hip arthroplasty 12/22/15 admitted from SNF on 01/17/2016 with failure to thrive, increased confusion, poor intake. Found to have pneumonia (suspicious for aspiration) and UTI.   Clinical Assessment and Goals of Care: I met today at Ms. Bisig' bedside with daughters, Juliann Pulse and Caren Griffins. They are both aware of the severity of their mother's infection and acknowledge acute decline over the past few weeks especially. She had a hip fracture and repair ~4 weeks ago and has not really recovered well.   They acknowledge that she is at end of life now and they confirm DNR. They say that she did not want "any heroic or extraordinary measures." They talk of taking her home and we discuss hospice benefit and options such as home with hospice vs hospice facility. They want her comfortable and do not want her to suffer at all - this is their main goal. However, they struggle with stopping IV fluids and antibiotics. Emotional support provided and I will follow up tomorrow.   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 Prior to retirement she petitioned the county to pay her a nurse's salary instead of Conservation officer, historic buildings. After retirement they say she bought an RV and would go off with friends and sisters and visited all states and [place] and [place]. She certainly has lead a full and satisfying life per her daughters report.   Primary Decision Maker NEXT OF KIN 3 children Nash Mantis    SUMMARY OF RECOMMENDATIONS     - DNR - Comfort focused care - Continue IVF/antibiotics for now - Considering hospice options  Code Status/Advance Care Planning:  DNR   Symptom Management:   Pain/dyspnea: Morphine 1 mg every 3 hours prn.   Poor output: Bladder scan if no output x 8 hours and place foley if > 400 cc for comfort.   Palliative Prophylaxis:   Aspiration, Frequent Pain Assessment, Oral Care and Turn Reposition  Additional Recommendations (Limitations, Scope, Preferences):  No Artificial Feeding and No Hemodialysis  Psycho-social/Spiritual:   Desire for further Chaplaincy support:yes  Additional Recommendations: Caregiving  Support/Resources and Education on Hospice  Prognosis:   < 2 weeks  Discharge Planning: To Be Determined      Primary Diagnoses: Present on Admission: . Sepsis ([place])   I have reviewed the medical record, interviewed the patient and family, and examined the patient. The following aspects are pertinent.  Past Medical History:  Diagnosis Date  . Dementia   . Parkinson disease [facility])    Social History   Social History  . Marital status: Widowed    Spouse name: N/A  . Number of children:  N/A  . Years of education: N/A   Social History Main Topics  . Smoking status: Former Research scientist (life sciences)  . Smokeless tobacco: Never Used  . Alcohol use No  . Drug use: No  . Sexual activity: No   Other Topics Concern  . None   Social History Narrative  . None   Family History  Problem Relation Age of Onset  . Diabetes Mother   . Alzheimer's disease Sister   . Alzheimer's disease Brother    Scheduled Meds: . carbidopa-levodopa  0.5 tablet Oral TID  . ceFEPime (MAXIPIME) IV  2 g Intravenous Q24H  . cholecalciferol  2,000 Units Oral BH-q7a  . collagenase   Topical Daily  . enoxaparin (LOVENOX) injection  30 mg Subcutaneous Q24H  . ferrous sulfate  300 mg Oral TID WC  . methimazole  2.5 mg Oral BH-q7a  . mirtazapine  30 mg Oral QHS  . pantoprazole (PROTONIX) IV  40 mg  Intravenous Q24H  . QUEtiapine  25 mg Oral BID  . traZODone  25 mg Oral TID  . vitamin C  500 mg Oral BID   Continuous Infusions: . sodium chloride 75 mL/hr at 01/19/16 0422   PRN Meds:.acetaminophen **OR** acetaminophen, morphine injection, ondansetron **OR** ondansetron (ZOFRAN) IV Allergies  Allergen Reactions  . Buspirone Other (See Comments)    UTI and Shakiness  . Citalopram Other (See Comments)    Severe Confusion  . Clonazepam Other (See Comments)    Panic attacks and paranoid  . Hydroxyzine Hives  . Sertraline Other (See Comments)    parkinson's type shaking  . Codeine Rash  . Hydrocodone Rash  . Neomycin Rash  . Oxycodone Rash  . Penicillins Rash    Has patient had a PCN reaction causing immediate rash, facial/tongue/throat swelling, SOB or lightheadedness with hypotension: Yes Has patient had a PCN reaction causing severe rash involving mucus membranes or skin necrosis: No Has patient had a PCN reaction that required hospitalization No Has patient had a PCN reaction occurring within the last 10 years: No If all of the above answers are "NO", then may proceed with Cephalosporin use.  . Sulfa Antibiotics Rash   Review of Systems  Unable to perform ROS: Acuity of condition    Physical Exam  Constitutional: She appears well-developed. She appears cachectic.  HENT:  Head: Normocephalic and atraumatic.  Cardiovascular: Normal rate.   Pulmonary/Chest: Effort normal. No accessory muscle usage. No tachypnea. No respiratory distress.  Abdominal: Normal appearance.  Neurological: She is unresponsive.  Nursing note and vitals reviewed.   Vital Signs: BP 122/65 (BP Location: Left Arm)   Pulse (!) 101   Temp 98.3 F (36.8 C) (Oral)   Resp 18   Ht _0  (1.549 m)   Wt 44.5 kg (98 lb)   SpO2 95%   BMI 18.52 kg/m  Pain Assessment: No/denies pain POSS *See Group Information*: 2-Acceptable,Slightly drowsy, easily aroused Pain Score: Asleep   SpO2: SpO2: 95 % O2  Device:SpO2: 95 % O2 Flow Rate: .   IO: Intake/output summary:  Intake/Output Summary (Last 24 hours) at 01/19/16 1600 Last data filed at 01/19/16 1447  Gross per 24 hour  Intake             1025 ml  Output                0 ml  Net             1025 ml    LBM: Last BM Date:  01/15/16 Baseline Weight: Weight: 44.5 kg (98 lb) Most recent weight: Weight: 44.5 kg (98 lb)     Palliative Assessment/Data:   Flowsheet Rows   Flowsheet Row Most Recent Value  Intake Tab  Referral Department  Hospitalist  Unit at Time of Referral  Orthopedic Unit  Palliative Care Primary Diagnosis  Neurology  Date Notified  01/18/16  Palliative Care Type  New Palliative care  Reason for referral  Clarify Goals of Care  Date of Admission  01/17/16  # of days IP prior to Palliative referral  1  Clinical Assessment  Psychosocial & Spiritual Assessment  Palliative Care Outcomes      Time In: 2903 Time Out: 1600 Time Total: 1mn Greater than 50%  of this time was spent counseling and coordinating care related to the above assessment and plan.  Signed by: AVinie Sill NP Palliative Medicine Team Pager # [phone](M-F 8a-5p) Team Phone # [phone](Nights/Weekends)

## 2016-01-19 NOTE — Plan of Care (Signed)
Problem: SLP Dysphagia Goals Goal: Misc Dysphagia Goal Pt will safely tolerate po diet of least restrictive consistency w/ no overt s/s of aspiration noted by Staff/pt/family x3 sessions.    

## 2016-01-20 MED ORDER — MORPHINE SULFATE (CONCENTRATE) 10 MG/0.5ML PO SOLN: 2.5000 mg | ORAL | Status: AC | PRN

## 2016-01-20 MED ORDER — CEPHALEXIN 500 MG PO CAPS: 500.0000 mg | ORAL_CAPSULE | Freq: Two times a day (BID) | ORAL | 0 refills | Status: AC

## 2016-01-20 MED ORDER — CEFEPIME-DEXTROSE 2 GM/50ML IV SOLR
2.0000 g | INTRAVENOUS | Status: DC
  Filled 2016-01-20: qty 50

## 2016-01-20 MED ORDER — CEPHALEXIN 500 MG PO CAPS: 500.0000 mg | ORAL_CAPSULE | Freq: Two times a day (BID) | ORAL | Status: DC

## 2016-01-20 MED ORDER — MORPHINE SULFATE (CONCENTRATE) 10 MG/0.5ML PO SOLN: 2.5000 mg | ORAL | Status: DC | PRN

## 2016-01-20 MED ORDER — DEXTROSE 5 % IV SOLN
2.0000 g | INTRAVENOUS | Status: DC
  Filled 2016-01-20: qty 2

## 2016-01-20 MED ORDER — PANTOPRAZOLE SODIUM 40 MG PO PACK
40.0000 mg | PACK | Freq: Every day | ORAL | Status: DC
  Filled 2016-01-20: qty 20

## 2016-01-20 NOTE — Progress Notes (Addendum)
Clinical Child psychotherapistocial Worker (CSW) received consult for residential hospice placement. CSW contacted patient's daughter Aram BeechamCynthia and provided hospice choice. Daughter chose [facility]. Per Clydie BraunKaren hospice liaison they have a bed today for patient. CSW prepared EMS packet. Patient will D/C to Hospice Facility today. Please reconsult if future social work needs arise. CSW signing off.   Baker Hughes IncorporatedBailey Kayra Crowell, LCSW [phone]

## 2016-01-20 NOTE — Discharge Summary (Signed)
[employer] [facility] [place] at [facility]   PATIENT NAME: Allison Pollard    MR#:  [id number]  DATE OF BIRTH:  [birth year]  DATE OF ADMISSION:  01/17/2016   ADMITTING PHYSICIAN: Wyatt Haste, MD  DATE OF DISCHARGE:01/20/2016 PRIMARY CARE PHYSICIAN: HART, Charlton Amor, MD   ADMISSION DIAGNOSIS:  Hospital-acquired pneumonia [J18.9] Sepsis, due to unspecified organism (HCC) [A41.9] Urinary tract infection without hematuria, site unspecified [N39.0] DISCHARGE DIAGNOSIS:  Active Problems:   Sepsis (HCC)   Pressure injury of skin   Goals of care, counseling/discussion   Palliative care encounter .Sepsis with UTI and PNA (RLL), healthcare associated pneumonia. SECONDARY DIAGNOSIS:   Past Medical History:  Diagnosis Date  . Dementia   . Parkinson disease [facility])    HOSPITAL COURSE:   82 year old Caucasian female history of Parkinson's and dementia presenting with failure to thrive  1.Sepsis with UTI and PNA (RLL), healthcare associated pneumonia.  She was treated wtih vancomycin and Azactam.  Changed to cefepime and discontinued vancomycin due to negative MRSA. Follow-up blood culture. Leukocytosis improved. Urine cultures showed Escherichia coli, sensitive to Rocephin. Change to keflex for 5 days.  2. Hyperthyroidism, onTapazole. TSH 0.376, T4 1.1. 3. GERD without esophagitis PPI therapy 4. Alzheimer's dementia Aricept. Aspiration and fall precaution.  Hypokalemia. Given potassium supplement. Improved.  Anemia. Hemoglobin decreased from 11.8 to 8.5. Possible due to IV fluid dilution, anemia workup: Iron deficiency. Follow-up hemoglobin is stable. Start ferrous sulfate. Failure to thrive and malnutrition.  Sacral DU. Unstageable. She got wound care.  PT evaluation suggested nursing home placement.  poor prognosis.  The patient has 3 children but no POA, palliative care team is following. Per palliative care team, the patient's CODE STATUS is changed  to DO NOT RESUSCITATE after discussion this family members. Comfort is the main goal and focus but family members wish to continue antibiotics. I discussed with patient's daughter about hospice today. The plan is to discharge the patient to hospice home today. I discussed with palliative care team.  DISCHARGE CONDITIONS:  Poor prognosis, discharge to hospice today. CONSULTS OBTAINED:   DRUG ALLERGIES:   Allergies  Allergen Reactions  . Buspirone Other (See Comments)    UTI and Shakiness  . Citalopram Other (See Comments)    Severe Confusion  . Clonazepam Other (See Comments)    Panic attacks and paranoid  . Hydroxyzine Hives  . Sertraline Other (See Comments)    parkinson's type shaking  . Codeine Rash  . Hydrocodone Rash  . Neomycin Rash  . Oxycodone Rash  . Penicillins Rash    Has patient had a PCN reaction causing immediate rash, facial/tongue/throat swelling, SOB or lightheadedness with hypotension: Yes Has patient had a PCN reaction causing severe rash involving mucus membranes or skin necrosis: No Has patient had a PCN reaction that required hospitalization No Has patient had a PCN reaction occurring within the last 10 years: No If all of the above answers are "NO", then may proceed with Cephalosporin use.  . Sulfa Antibiotics Rash   DISCHARGE MEDICATIONS:     Medication List    STOP taking these medications   carbidopa-levodopa 25-100 MG tablet Commonly known as:  SINEMET IR   donepezil 10 MG tablet Commonly known as:  ARICEPT   enoxaparin 30 MG/0.3ML injection Commonly known as:  LOVENOX   levofloxacin 500 MG tablet Commonly known as:  LEVAQUIN   methimazole 5 MG tablet Commonly known as:  TAPAZOLE   mirtazapine 30 MG tablet Commonly known as:  REMERON   omeprazole 20 MG capsule Commonly known as:  PRILOSEC   QUEtiapine 25 MG tablet Commonly known as:  SEROQUEL   traMADol 50 MG tablet Commonly known as:  ULTRAM   traZODone 50 MG  tablet Commonly known as:  DESYREL   vitamin C 500 MG tablet Commonly known as:  ASCORBIC ACID   Vitamin D3 2000 units Tabs     TAKE these medications   acetaminophen 325 MG tablet Commonly known as:  TYLENOL Take 2 tablets (650 mg total) by mouth every 6 (six) hours as needed for mild pain (or Fever >/= 101).   cephALEXin 500 MG capsule Commonly known as:  KEFLEX Take 1 capsule (500 mg total) by mouth every 12 (twelve) hours.   docusate sodium 100 MG capsule Commonly known as:  COLACE Take 1 capsule (100 mg total) by mouth 2 (two) times daily.   fluticasone 50 MCG/ACT nasal spray Commonly known as:  FLONASE Place 2 sprays into both nostrils daily as needed for rhinitis.   methocarbamol 500 MG tablet Commonly known as:  ROBAXIN Take 1 tablet (500 mg total) by mouth every 6 (six) hours as needed for muscle spasms.   morphine CONCENTRATE 10 MG/0.5ML Soln concentrated solution Place 0.13-0.25 mLs (2.6-5 mg total) under the tongue every 2 (two) hours as needed for severe pain or shortness of breath.        DISCHARGE INSTRUCTIONS:  See AVS. If you experience worsening of your admission symptoms, develop shortness of breath, life threatening emergency, suicidal or homicidal thoughts you must seek medical attention immediately by calling 911 or calling your MD immediately  if symptoms less severe.  You Must read complete instructions/literature along with all the possible adverse reactions/side effects for all the Medicines you take and that have been prescribed to you. Take any new Medicines after you have completely understood and accpet all the possible adverse reactions/side effects.   Please note  You were cared for by a hospitalist during your hospital stay. If you have any questions about your discharge medications or the care you received while you were in the hospital after you are discharged, you can call the unit and asked to speak with the hospitalist on call if the  hospitalist that took care of you is not available. Once you are discharged, your primary care physician will handle any further medical issues. Please note that NO REFILLS for any discharge medications will be authorized once you are discharged, as it is imperative that you return to your primary care physician (or establish a relationship with a primary care physician if you do not have one) for your aftercare needs so that they can reassess your need for medications and monitor your lab values.    On the day of Discharge:  VITAL SIGNS:  Blood pressure 117/60, pulse (!) 103, temperature (!) 100.7 F (38.2 C), temperature source Axillary, resp. rate 16, height 5\' 1"  (1.549 m), weight 98 lb (44.5 kg), SpO2 96 %. PHYSICAL EXAMINATION:  GENERAL:  82 y.o.-year-old patient lying in the bed with no acute distress.  EYES: No scleral icterus. Extraocular muscles intact.  HEENT: Head atraumatic, normocephalic.  NECK:  Supple, no jugular venous distention. No thyroid enlargement, no tenderness.  LUNGS: Normal breath sounds bilaterally, no wheezing, rales,rhonchi or crepitation. No use of accessory muscles of respiration.  CARDIOVASCULAR: S1, S2 normal. No murmurs, rubs, or gallops.  ABDOMEN: Soft, non-tender, non-distended. Bowel sounds present. No organomegaly or mass.  EXTREMITIES: No pedal edema, cyanosis,  or clubbing.  NEUROLOGIC: The patient is demented, unable to exam. PSYCHIATRIC: The patient is Demented. SKIN: Sacral DU, unstageable.  DATA REVIEW:   CBC  Recent Labs Lab 01/18/16 0437 01/19/16 0343  WBC 8.8  --   HGB 8.5* 8.7*  HCT 25.4*  --   PLT 134*  --     Chemistries   Recent Labs Lab 01/17/16 1347 01/18/16 0437 01/19/16 0343  NA 141 143  --   K 4.9 3.4* 3.8  CL 107 114*  --   CO2 25 25  --   GLUCOSE 177* 110*  --   BUN 21* 17  --   CREATININE 0.83 0.60  --   CALCIUM 8.4* 7.6*  --   MG  --  1.9  --   AST 31  --   --   ALT 12*  --   --   ALKPHOS 54  --   --     BILITOT 1.0  --   --      Microbiology Results  Results for orders placed or performed during the hospital encounter of 01/17/16  Urine culture     Status: Abnormal   Collection Time: 01/17/16  1:52 PM  Result Value Ref Range Status   Specimen Description URINE, RANDOM  Final   Special Requests NONE  Final   Culture >=100,000 COLONIES/mL ESCHERICHIA COLI (A)  Final   Report Status 01/19/2016 FINAL  Final   Organism ID, Bacteria ESCHERICHIA COLI (A)  Final      Susceptibility   Escherichia coli - MIC*    AMPICILLIN >=32 RESISTANT Resistant     CEFAZOLIN <=4 SENSITIVE Sensitive     CEFTRIAXONE <=1 SENSITIVE Sensitive     CIPROFLOXACIN >=4 RESISTANT Resistant     GENTAMICIN <=1 SENSITIVE Sensitive     IMIPENEM <=0.25 SENSITIVE Sensitive     NITROFURANTOIN <=16 SENSITIVE Sensitive     TRIMETH/SULFA <=20 SENSITIVE Sensitive     AMPICILLIN/SULBACTAM 16 INTERMEDIATE Intermediate     PIP/TAZO <=4 SENSITIVE Sensitive     Extended ESBL NEGATIVE Sensitive     * >=100,000 COLONIES/mL ESCHERICHIA COLI  Blood Culture (routine x 2)     Status: None (Preliminary result)   Collection Time: 01/17/16  2:50 PM  Result Value Ref Range Status   Specimen Description BLOOD RIGHT FOREARM  Final   Special Requests BOTTLES DRAWN AEROBIC AND ANAEROBIC ANA8CC AER9CC  Final   Culture NO GROWTH 3 DAYS  Final   Report Status PENDING  Incomplete  Blood Culture (routine x 2)     Status: None (Preliminary result)   Collection Time: 01/17/16  2:55 PM  Result Value Ref Range Status   Specimen Description BLOOD LEFT FOREARM  Final   Special Requests BOTTLES DRAWN AEROBIC AND ANAEROBIC ANA.5CC AER5CC  Final   Culture NO GROWTH 3 DAYS  Final   Report Status PENDING  Incomplete    RADIOLOGY:  No results found.   Management plans discussed with the patient's daughter and they are in agreement.  CODE STATUS:     Code Status Orders        Start     Ordered   01/19/16 1552  Do not attempt  resuscitation (DNR)  Continuous    Question Answer Comment  In the event of cardiac or respiratory ARREST Do not call a "code blue"   In the event of cardiac or respiratory ARREST Do not perform Intubation, CPR, defibrillation or ACLS   In the event of  cardiac or respiratory ARREST Use medication by any route, position, wound care, and other measures to relive pain and suffering. May use oxygen, suction and manual treatment of airway obstruction as needed for comfort.      01/19/16 1555    Code Status History    Date Active Date Inactive Code Status Order ID Comments User Context   01/17/2016  2:53 PM 01/19/2016  3:55 PM Full Code [id number]  Wyatt Hasteavid K Hower, MD ED   12/21/2015  3:23 PM 12/25/2015 12:16 AM Full Code [id number]  Houston SirenVivek J Sainani, MD Inpatient   12/21/2015 12:50 PM 12/21/2015  3:22 PM Full Code [id number]  Kennedy BuckerMichael Menz, MD ED      TOTAL TIME TAKING CARE OF THIS PATIENT: 42 minutes.    Shaune Pollackhen, Izabellah Dadisman M.D on 01/20/2016 at 1:37 PM  Between 7am to 6pm - Pager - [phone]  After 6pm go to [email or web address] - Social research officer, governmentpassword [email or web address]  [employer] [place] [facility]  Office  [phone]  CC: Primary care physician; HART, Charlton AmorLAURA CHRISTINE, MD   Note: This dictation was prepared with Dragon dictation along with smaller phrase technology. Any transcriptional errors that result from this process are unintentional.

## 2016-01-20 NOTE — Progress Notes (Signed)
Advanced Care Plan.  Purpose of Encounter: hospice care. Parties in Attendance: Patient, the patient's daughter and me. Patient's Decisional Capacity: No. Subjective/Patient Story: Allison RossettiBarbara Tanzi  is a 82 y.o. female with a known history of Dementia, recent left hip arthroplasty performed 12/22/2015 who is presenting from the nursing facility or "failure to thrive" and poor oral intake.   Objective/Medical Story: The patient was found sepsis with UTI and PNA (RLL), healthcare associated pneumonia. She has been treated with antibiotics. However, the patient is demented and still has poor oral intake. She has very poor prognosis. Per palliative care team, the patient's CODE STATUS is changed to DO NOT RESUSCITATE after discussion this family members. I discussed with the patient's daughter, Olegario MessierKathy, about patient's current condition and possible hospice care in medical facility today. She discussed this with her sister and brother, then they agreed and decided hospice home placement. I discussed with palliative care team Helmut Musterlicia and social worker Fredric MareBailey about hospice home placement.   Goals of Care Determinations: Hospice home. Plan:  Code Status: DO NOT RESUSCITATE  Time spent discussing advance care planning: 25 minutes.

## 2016-01-20 NOTE — Progress Notes (Signed)
New hospice home referral received from [place] following a Palliative medicine consult. Allison Pollard is an 82 year old woman with a history of Parkinson's and dementia, admitted to [facility] on 11/28 with a  diagnoses of sepsis and failure to thrive. She has received IV antibiotics and IV fluids but has continued with little oral intake. She has several wounds and her albumin is 2.5. Her family has chosen to focus on her comfort with transfer to the Hospice home.  Writer met in the patient's room with her daughter Juliann Pulse to initiate education regarding hospice services, philosophy and team approach to care with good understanding voiced. Questions answered, consents signed. Patient was alert through out the visit and does appear oriented to self and family, her speech is very difficult to understand. She appeared uncomfortable. She has received tylenol x 1 dose on 11/29 per chart note review. Writer discussed pain management at the hospice home, Juliann Pulse was open to the use of liquid morphine. Report called to the hospice home, EMS notified for transport. Hospital care team all aware and in agreement. Signed portable DNR in place in discharge packet. Thank you for the opportunity to be involved in the care of this patient and her family.  Flossie Dibble, BSN, [place] [facility] [place], [facility] [phone] c

## 2016-01-20 NOTE — Progress Notes (Signed)
Pt [facility]. Family is aware and in agreement. PIV removed. She is leaving with all her belongings. Discharge packet provided. She is being transported via EMS.

## 2016-01-20 NOTE — Progress Notes (Addendum)
Speech Language Pathology Treatment: Dysphagia  Patient Details Name: Allison Pollard MRN: [id number] DOB: [birth year] Today's Date: 01/20/2016 Time: 1100-1130 SLP Time Calculation (min) (ACUTE ONLY): 30 min  Assessment / Plan / Recommendation Clinical Impression  Pt appears at increased risk for aspiration secondary to the moderate oral phase deficits noted and suspected pharyngeal phase dysphagia; noted min increased gurgly vocal quality at baseline today. Pt also exhibits declined Cognitive status (baseline) and presentation. Pt accepted a a trial of Nectar liquids via TSP but looked away when offered more. She exhibited oral phase deficits c/b decreased labial closure around utensil and reduced oral movements for bolus control and management w/ lengthier oral phase time (oral holding) and suspected delayed pharyngeal swallow timing. No overt coughing(delayed/immediate) was noted, however, ongoing wet, gurgly vocal quality present. Suspect pt could be having difficulty managing and clearing her own saliva.  Suspect pt's presentation could be related to baseline diagnosis of Dementia, and of Parkinson's Disease. Of note, pt's presentation has been declining for several months w/ ongoing issue of weight loss but more noticeable recently down 10 lbs. in the last 2 months per report. Patient's intake has been very poor w/ family focusing more on trying to get her to eat anything. Patient often needs reminders to chew and swallow foods per report.  Recommend a Dysphagia level 1 diet w/ Nectar liquids (as a trial) w/ strict aspiration precautions and feeding support - pt may benefit from just pleasure po's and/or wetting the mouth. Discussion w/ Daughter when present focused on oral care, moistening/wetting the mouth, and/or any pleasure amounts of a liquid such as water or Coke on a swab put to mouth. Discussed aspiration precautions w/ Daughter. ST services will be available for any questions while admitted.  Pt may be discharging w/ Hospice services today.     HPI HPI: Pt is is a 82 y.o. female with a known history of Dementia, recent left hip arthroplasty performed 12/22/2015 who is presenting from the nursing facility or "failure to thrive" the patient is unable to provide any information history taken from daughter present at bedside. States over the last week she has made a significant decline not eating drinking more confused and was brought to Hospital further workup and evaluation      SLP Plan  Continue with current plan of care     Recommendations  Diet recommendations: Dysphagia 1 (puree);Nectar-thick liquid Liquids provided via: Teaspoon Medication Administration: Crushed with puree (as able) Supervision: Staff to assist with self feeding;Full supervision/cueing for compensatory strategies Compensations: Minimize environmental distractions;Slow rate;Small sips/bites;Lingual sweep for clearance of pocketing;Multiple dry swallows after each bite/sip;Follow solids with liquid (check for oral clearing w/ any oral intake) Postural Changes and/or Swallow Maneuvers: Seated upright 90 degrees;Upright 30-60 min after meal   (STOP Feeding any po's IF pt is orally holding and not exhibiting a swallow to clear mouth)             General recommendations:  (palliative care following; Dietician) Oral Care Recommendations: Oral care BID;Staff/trained caregiver to provide oral care Follow up Recommendations: Skilled Nursing facility (TBD) Plan: Continue with current plan of care       GO               Jerilynn SomKatherine Arvetta Araque, MS, CCC-SLP  [name] 01/20/2016, 11:32 AM

## 2016-01-20 NOTE — Progress Notes (Signed)
PHARMACIST - PHYSICIAN COMMUNICATION  CONCERNING: IV to Oral Route Change Policy  RECOMMENDATION: This patient is receiving PROTONIX by the intravenous route.  Based on criteria approved by the Pharmacy and Therapeutics Committee, the intravenous medication(s) is/are being converted to the equivalent oral dose form(s).   DESCRIPTION: These criteria include:  The patient is eating (either orally or via tube) and/or has been taking other orally administered medications for a least 24 hours  The patient has no evidence of active gastrointestinal bleeding or impaired GI absorption (gastrectomy, short bowel, patient on TNA or NPO).  If you have questions about this conversion, please contact the Pharmacy Department  []   [phone]( [phone] )  [facility] [x]   [phone]( [phone] )  [facility] []   [phone]( [phone] )  [facility] []   [phone]( [phone] )  [facility] []   [phone]( [phone] )  [facility]   HigbeeMerrill,Rayon Mcchristian A, [facility] ScrevenRPH 01/20/2016 9:29 AM

## 2016-01-20 NOTE — Progress Notes (Signed)
Palliative:  I met at bedside with daughter Allison Pollard again this morning. She confirms desire for hospice placement. We discussed IVF and antibiotics. They would like her to receive po antibiotics IF she is able to take them. They understand if she cannot swallow than antibiotics are of limited benefit anyway.   Allison Pollard would also like to be discreet and not share with extended family or friends her prognosis yet (awaiting until she gets settled in hospice). Please be aware of discussions in room and with whom as there has been a niece at bedside this morning as well.   No Charge  [name], NP Palliative Medicine Team Pager # [phone] (M-F 8a-5p) Team Phone # [phone] (Nights/Weekends)

## 2016-01-22 LAB — CULTURE, BLOOD (ROUTINE X 2)
Culture: NO GROWTH
Culture: NO GROWTH

## 2016-02-20 DEATH — deceased

## 2016-02-23 ENCOUNTER — Encounter: Payer: Self-pay | Admitting: Orthopedic Surgery

## 2018-10-22 IMAGING — CR DG CHEST 1V
1 series · 1 of 1 positions shown · non-contrast
Comparison: August 09, 2011

CLINICAL DATA: Pain following fall

EXAM:
CHEST 1 VIEW

[chest ap]
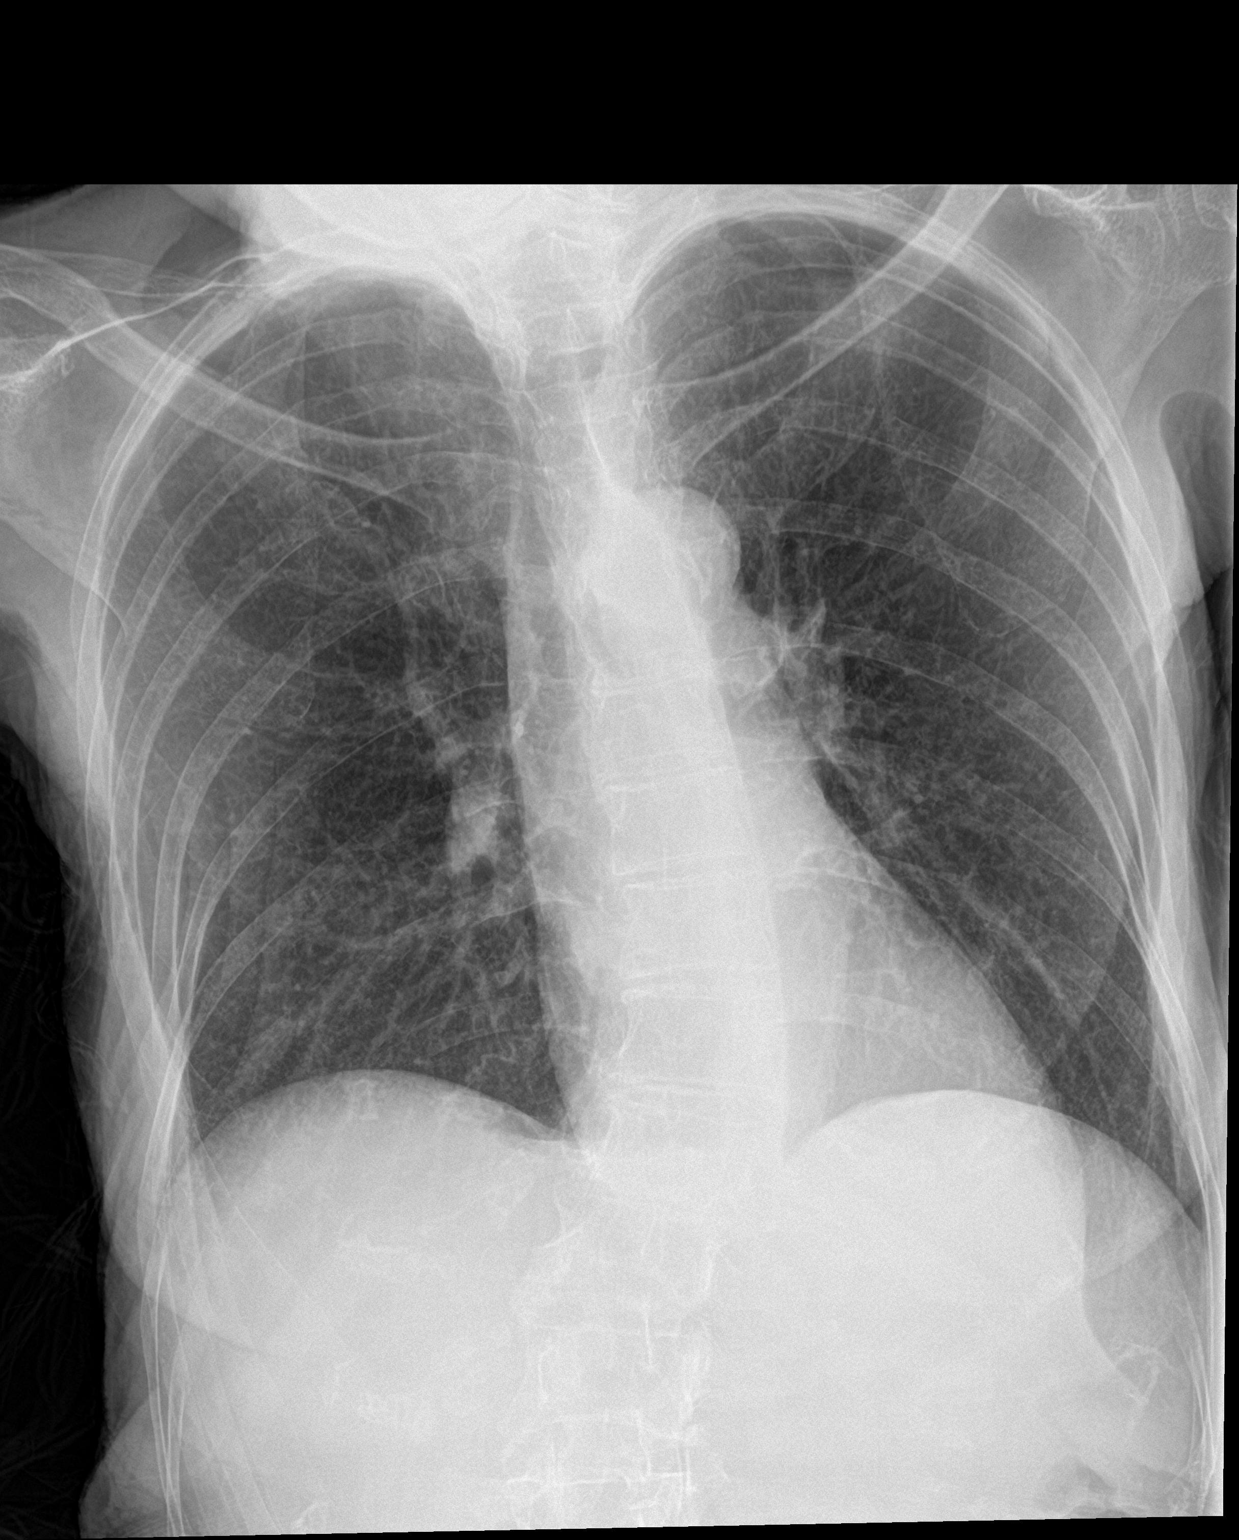

[1 of 1 positions shown; findings below may reference images not displayed]

FINDINGS: There is no edema or consolidation. The heart size and pulmonary
vascularity are normal. No adenopathy. There is atherosclerotic
calcification in the aorta. There is thoracolumbar levoscoliosis.
IMPRESSION: No edema or consolidation.  There is aortic atherosclerosis.

## 2018-11-18 IMAGING — DX DG CHEST 1V PORT
1 series · 1 of 1 positions shown · non-contrast
Comparison: 12/21/2015

CLINICAL DATA: Dementia Fahrenheit. Hip fracture. Loss of appetite.

EXAM:
PORTABLE CHEST 1 VIEW

[chest ap]
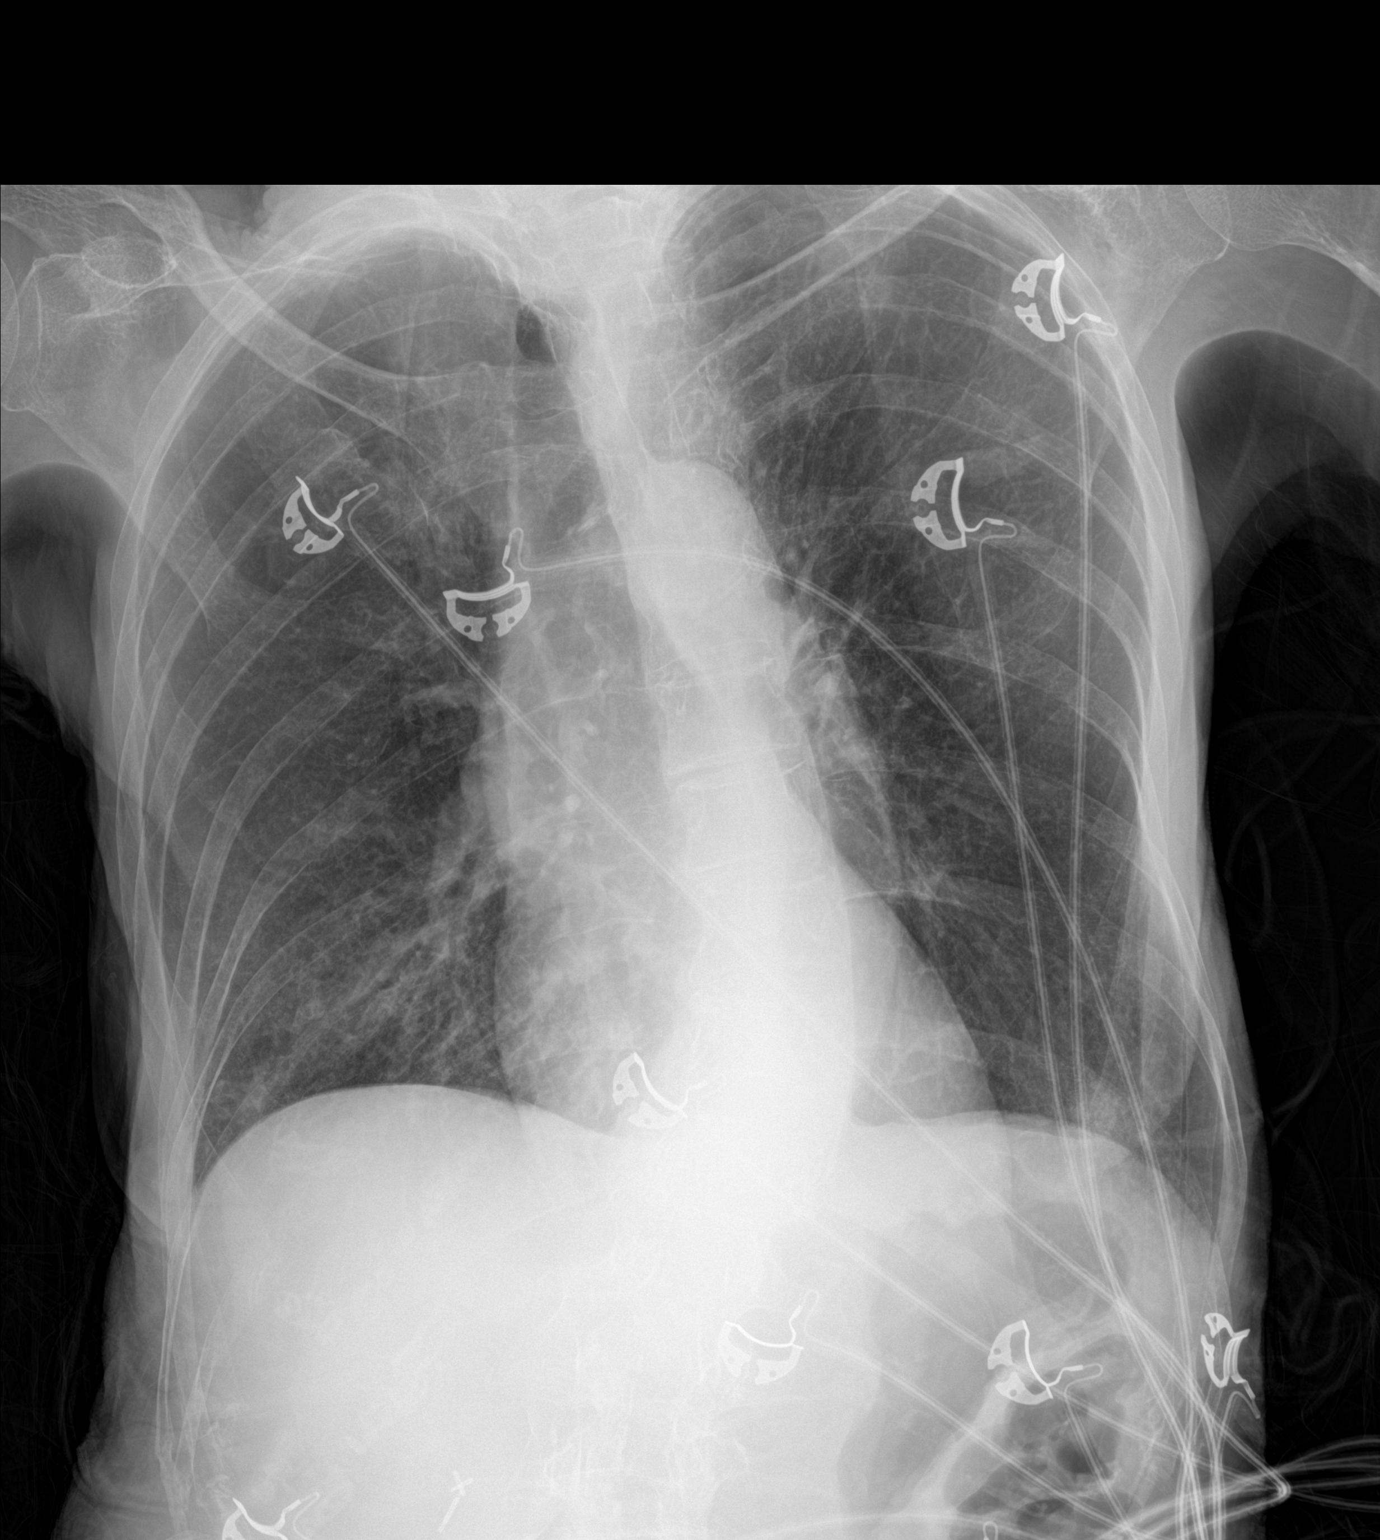

[1 of 1 positions shown; findings below may reference images not displayed]

FINDINGS: Heart size is normal. Mediastinal shadows are normal. The left lung
is clear. There is patchy pneumonia in the right lower lobe. No
dense consolidation, collapse or effusion. Bony structures show
chronic spinal curvature.
IMPRESSION: Patchy pneumonia right lower lobe.
